# Patient Record
Sex: Female | Born: 1970 | Race: Black or African American | Hispanic: No | Marital: Single | Smoking: Former smoker
Health system: Southern US, Community
[De-identification: ages and names within clinical notes are randomized; demographics above are authoritative.]

## PROBLEM LIST (undated history)

## (undated) DIAGNOSIS — E669 Obesity, unspecified: Secondary | ICD-10-CM

## (undated) DIAGNOSIS — F32A Depression, unspecified: Secondary | ICD-10-CM

## (undated) DIAGNOSIS — M1990 Unspecified osteoarthritis, unspecified site: Secondary | ICD-10-CM

## (undated) DIAGNOSIS — D649 Anemia, unspecified: Secondary | ICD-10-CM

## (undated) DIAGNOSIS — R51 Headache: Secondary | ICD-10-CM

## (undated) DIAGNOSIS — F329 Major depressive disorder, single episode, unspecified: Secondary | ICD-10-CM

## (undated) DIAGNOSIS — R519 Headache, unspecified: Secondary | ICD-10-CM

## (undated) DIAGNOSIS — F419 Anxiety disorder, unspecified: Secondary | ICD-10-CM

## (undated) DIAGNOSIS — E119 Type 2 diabetes mellitus without complications: Secondary | ICD-10-CM

## (undated) HISTORY — DX: Major depressive disorder, single episode, unspecified: F32.9

## (undated) HISTORY — DX: Type 2 diabetes mellitus without complications: E11.9

## (undated) HISTORY — DX: Anxiety disorder, unspecified: F41.9

## (undated) HISTORY — DX: Depression, unspecified: F32.A

## (undated) HISTORY — DX: Obesity, unspecified: E66.9

## (undated) HISTORY — DX: Anemia, unspecified: D64.9

## (undated) HISTORY — PX: OTHER SURGICAL HISTORY: SHX169

## (undated) HISTORY — DX: Unspecified osteoarthritis, unspecified site: M19.90

## (undated) HISTORY — DX: Headache: R51

## (undated) HISTORY — PX: TUBAL LIGATION: SHX77

## (undated) HISTORY — DX: Headache, unspecified: R51.9

---

## 2001-08-10 ENCOUNTER — Emergency Department (HOSPITAL_COMMUNITY): Admission: EM | Admit: 2001-08-10 | Discharge: 2001-08-10 | Payer: Self-pay | Admitting: Emergency Medicine

## 2001-08-11 ENCOUNTER — Emergency Department (HOSPITAL_COMMUNITY): Admission: EM | Admit: 2001-08-11 | Discharge: 2001-08-11 | Payer: Self-pay | Admitting: Emergency Medicine

## 2001-08-14 ENCOUNTER — Emergency Department (HOSPITAL_COMMUNITY): Admission: EM | Admit: 2001-08-14 | Discharge: 2001-08-14 | Payer: Self-pay | Admitting: Emergency Medicine

## 2002-03-06 ENCOUNTER — Other Ambulatory Visit: Admission: RE | Admit: 2002-03-06 | Discharge: 2002-03-06 | Payer: Self-pay | Admitting: Family Medicine

## 2002-10-04 ENCOUNTER — Emergency Department (HOSPITAL_COMMUNITY): Admission: EM | Admit: 2002-10-04 | Discharge: 2002-10-04 | Payer: Self-pay | Admitting: *Deleted

## 2004-03-18 ENCOUNTER — Other Ambulatory Visit: Admission: RE | Admit: 2004-03-18 | Discharge: 2004-03-18 | Payer: Self-pay | Admitting: Family Medicine

## 2004-04-27 ENCOUNTER — Encounter: Admission: RE | Admit: 2004-04-27 | Discharge: 2004-04-27 | Payer: Self-pay | Admitting: Nephrology

## 2004-07-16 ENCOUNTER — Ambulatory Visit (HOSPITAL_COMMUNITY): Admission: RE | Admit: 2004-07-16 | Discharge: 2004-07-16 | Payer: Self-pay | Admitting: Obstetrics

## 2004-11-11 ENCOUNTER — Emergency Department (HOSPITAL_COMMUNITY): Admission: EM | Admit: 2004-11-11 | Discharge: 2004-11-11 | Payer: Self-pay | Admitting: Family Medicine

## 2006-11-17 ENCOUNTER — Emergency Department (HOSPITAL_COMMUNITY): Admission: EM | Admit: 2006-11-17 | Discharge: 2006-11-17 | Payer: Self-pay | Admitting: Family Medicine

## 2006-11-23 ENCOUNTER — Emergency Department (HOSPITAL_COMMUNITY): Admission: EM | Admit: 2006-11-23 | Discharge: 2006-11-24 | Payer: Self-pay | Admitting: Emergency Medicine

## 2008-02-28 ENCOUNTER — Ambulatory Visit: Payer: Self-pay | Admitting: Internal Medicine

## 2008-02-28 DIAGNOSIS — F411 Generalized anxiety disorder: Secondary | ICD-10-CM | POA: Insufficient documentation

## 2008-02-29 LAB — CONVERTED CEMR LAB
ALT: 16 units/L (ref 0–35)
Albumin: 3.5 g/dL (ref 3.5–5.2)
BUN: 9 mg/dL (ref 6–23)
Basophils Relative: 0.7 % (ref 0.0–1.0)
Chloride: 108 meq/L (ref 96–112)
GFR calc Af Amer: 90 mL/min
HCT: 36.4 % (ref 36.0–46.0)
Hemoglobin: 12.2 g/dL (ref 12.0–15.0)
MCHC: 33.4 g/dL (ref 30.0–36.0)
MCV: 94.6 fL (ref 78.0–100.0)
Monocytes Relative: 5.7 % (ref 3.0–12.0)
Neutrophils Relative %: 67.8 % (ref 43.0–77.0)
Platelets: 206 10*3/uL (ref 150–400)
Potassium: 4.3 meq/L (ref 3.5–5.1)
RDW: 12.5 % (ref 11.5–14.6)
Sodium: 142 meq/L (ref 135–145)
Total Protein: 7.4 g/dL (ref 6.0–8.3)
WBC: 10.8 10*3/uL — ABNORMAL HIGH (ref 4.5–10.5)

## 2008-03-15 ENCOUNTER — Telehealth (INDEPENDENT_AMBULATORY_CARE_PROVIDER_SITE_OTHER): Payer: Self-pay | Admitting: *Deleted

## 2008-03-18 ENCOUNTER — Telehealth (INDEPENDENT_AMBULATORY_CARE_PROVIDER_SITE_OTHER): Payer: Self-pay | Admitting: *Deleted

## 2008-04-22 ENCOUNTER — Telehealth: Payer: Self-pay | Admitting: Internal Medicine

## 2008-05-09 ENCOUNTER — Ambulatory Visit: Payer: Self-pay | Admitting: Internal Medicine

## 2008-05-13 ENCOUNTER — Telehealth: Payer: Self-pay | Admitting: Internal Medicine

## 2008-07-24 ENCOUNTER — Encounter: Payer: Self-pay | Admitting: Internal Medicine

## 2008-07-24 ENCOUNTER — Ambulatory Visit: Payer: Self-pay | Admitting: Internal Medicine

## 2008-08-21 ENCOUNTER — Ambulatory Visit: Payer: Self-pay | Admitting: Internal Medicine

## 2008-08-21 DIAGNOSIS — F329 Major depressive disorder, single episode, unspecified: Secondary | ICD-10-CM

## 2008-09-09 ENCOUNTER — Ambulatory Visit: Payer: Self-pay | Admitting: Licensed Clinical Social Worker

## 2008-09-12 ENCOUNTER — Ambulatory Visit: Payer: Self-pay | Admitting: Licensed Clinical Social Worker

## 2008-09-12 ENCOUNTER — Ambulatory Visit: Payer: Self-pay | Admitting: Internal Medicine

## 2008-09-17 ENCOUNTER — Ambulatory Visit: Payer: Self-pay | Admitting: Licensed Clinical Social Worker

## 2008-10-21 ENCOUNTER — Ambulatory Visit: Payer: Self-pay | Admitting: Licensed Clinical Social Worker

## 2008-10-29 ENCOUNTER — Ambulatory Visit: Payer: Self-pay | Admitting: Internal Medicine

## 2008-10-30 ENCOUNTER — Encounter: Payer: Self-pay | Admitting: Internal Medicine

## 2008-11-11 ENCOUNTER — Telehealth: Payer: Self-pay | Admitting: Family Medicine

## 2008-11-21 ENCOUNTER — Telehealth: Payer: Self-pay | Admitting: Internal Medicine

## 2008-12-16 ENCOUNTER — Ambulatory Visit (HOSPITAL_COMMUNITY): Admission: RE | Admit: 2008-12-16 | Discharge: 2008-12-16 | Payer: Self-pay | Admitting: Internal Medicine

## 2008-12-16 ENCOUNTER — Ambulatory Visit: Payer: Self-pay | Admitting: Internal Medicine

## 2008-12-16 LAB — HM MAMMOGRAPHY

## 2009-01-06 ENCOUNTER — Encounter: Payer: Self-pay | Admitting: Internal Medicine

## 2009-06-23 ENCOUNTER — Encounter: Payer: Self-pay | Admitting: Internal Medicine

## 2009-06-23 ENCOUNTER — Telehealth: Payer: Self-pay | Admitting: Internal Medicine

## 2009-09-05 ENCOUNTER — Telehealth: Payer: Self-pay | Admitting: Internal Medicine

## 2009-09-08 ENCOUNTER — Encounter: Payer: Self-pay | Admitting: Family Medicine

## 2009-12-16 ENCOUNTER — Ambulatory Visit: Payer: Self-pay | Admitting: Family Medicine

## 2009-12-16 ENCOUNTER — Ambulatory Visit: Payer: Self-pay | Admitting: Licensed Clinical Social Worker

## 2009-12-16 ENCOUNTER — Encounter: Payer: Self-pay | Admitting: Internal Medicine

## 2009-12-16 LAB — CONVERTED CEMR LAB
ALT: 24 units/L (ref 0–35)
Alkaline Phosphatase: 69 units/L (ref 39–117)
Basophils Absolute: 0.1 10*3/uL (ref 0.0–0.1)
CO2: 32 meq/L (ref 19–32)
Chloride: 103 meq/L (ref 96–112)
Eosinophils Relative: 0.9 % (ref 0.0–5.0)
Folate: >20 ng/mL
Lymphocytes Relative: 32.7 % (ref 12.0–46.0)
Monocytes Absolute: 0.7 10*3/uL (ref 0.1–1.0)
Monocytes Relative: 7.9 % (ref 3.0–12.0)
Neutro Abs: 5 10*3/uL (ref 1.4–7.7)
Neutrophils Relative %: 57.9 % (ref 43.0–77.0)
Platelets: 288 10*3/uL (ref 150.0–400.0)
Sodium: 139 meq/L (ref 135–145)
TSH: 0.88 microintl units/mL (ref 0.35–5.50)
Transferrin: 235 mg/dL (ref 212.0–360.0)
Vitamin B-12: 1305 pg/mL — ABNORMAL HIGH (ref 211–911)

## 2009-12-18 ENCOUNTER — Ambulatory Visit: Payer: Self-pay | Admitting: Internal Medicine

## 2010-01-22 ENCOUNTER — Emergency Department (HOSPITAL_COMMUNITY): Admission: EM | Admit: 2010-01-22 | Discharge: 2010-01-22 | Payer: Self-pay | Admitting: Family Medicine

## 2010-07-08 ENCOUNTER — Ambulatory Visit: Payer: Self-pay | Admitting: Internal Medicine

## 2010-07-13 LAB — CONVERTED CEMR LAB

## 2010-07-27 LAB — CONVERTED CEMR LAB
AST: 25 units/L (ref 0–37)
Alkaline Phosphatase: 57 units/L (ref 39–117)
Basophils Relative: 0.7 % (ref 0.0–3.0)
Eosinophils Relative: 3 % (ref 0.0–5.0)
HCT: 33.4 % — ABNORMAL LOW (ref 36.0–46.0)
Hemoglobin: 11 g/dL — ABNORMAL LOW (ref 12.0–15.0)
MCV: 95.5 fL (ref 78.0–100.0)
Monocytes Relative: 8.6 % (ref 3.0–12.0)
Neutro Abs: 5.9 10*3/uL (ref 1.4–7.7)
Neutrophils Relative %: 55.8 % (ref 43.0–77.0)
Platelets: 266 10*3/uL (ref 150.0–400.0)
RDW: 14 % (ref 11.5–14.6)
Total Protein: 7.6 g/dL (ref 6.0–8.3)

## 2010-12-06 ENCOUNTER — Encounter: Payer: Self-pay | Admitting: Obstetrics

## 2010-12-13 LAB — CONVERTED CEMR LAB
Basophils Relative: 0.4 % (ref 0.0–1.0)
Eosinophils Absolute: 0.1 10*3/uL (ref 0.0–0.7)
Eosinophils Relative: 0.9 % (ref 0.0–5.0)
HCT: 35.1 % — ABNORMAL LOW (ref 36.0–46.0)
Hemoglobin: 12.1 g/dL (ref 12.0–15.0)
Lymphocytes Relative: 30.9 % (ref 12.0–46.0)
MCHC: 34.6 g/dL (ref 30.0–36.0)
Neutro Abs: 5.5 10*3/uL (ref 1.4–7.7)
RBC: 3.72 M/uL — ABNORMAL LOW (ref 3.87–5.11)
RDW: 12.9 % (ref 11.5–14.6)
WBC: 9.3 10*3/uL (ref 4.5–10.5)

## 2010-12-15 NOTE — Assessment & Plan Note (Signed)
Summary: STOMACH HURT WHEN DRINKING MILK/NJR   Vital Signs:  Patient profile:   40 year old female Weight:      211 pounds BMI:     33.67 Temp:     98.9 degrees F oral Pulse rate:   68 / minute Pulse rhythm:   regular Resp:     12 per minute BP sitting:   126 / 60  (left arm) Cuff size:   regular  Vitals Entered By: Gladis Riffle, RN (July 08, 2010 8:21 AM) CC: c/o stomach pain when eats ice cream or drinks milk x 1 month--requests HIV testing Is Patient Diabetic? No   CC:  c/o stomach pain when eats ice cream or drinks milk x 1 month--requests HIV testing.  History of Present Illness: abdominal discomfort after drinking milk or eating icecream accompanied by a bloated stomach and "gas" duration 1 month  All other systems reviewed and were negative   Preventive Screening-Counseling & Management  Alcohol-Tobacco     Smoking Status: never  Current Problems (verified): 1)  Depression  (ICD-311) 2)  Anxiety  (ICD-300.00)  Current Medications (verified): 1)  None  Allergies: 1)  ! Cymbalta (Duloxetine Hcl) 2)  ! Depakote (Divalproex Sodium)  Past History:  Past Medical History: Last updated: 08/21/2008 Anxiety Depression  Past Surgical History: Last updated: 02/28/2008 Tubal ligation cyst under wisdom tooth  Family History: Last updated: 05/09/2008 father-CABG (unknown age), asthma Family History High cholesterol Family History Hypertension Mother-DM, htn, asthma  Social History: Last updated: 02/28/2008 Occupation:-post of[employer] Single 1 child---healthy Never Smoked Alcohol use-no Regular exercise-no  Risk Factors: Exercise: no (02/28/2008)  Risk Factors: Smoking Status: never (07/08/2010)  Physical Exam  General:  alert and well-developed.   Eyes:  no icterus Abdomen:  Bowel sounds positive,abdomen soft and non-tender without masses, organomegaly or hernias noted. Skin:  turgor normal and color normal.     Impression &  Recommendations:  Problem # 1:  ABDOMINAL PAIN (ICD-789.00)  suspect lactose intolerance discussed avoidance of foods could try lactaid-type products  sxs could be related ot gallbladder---check labs today  Orders: Venipuncture (09811) TLB-CBC Platelet - w/Differential (85025-CBCD) TLB-Hepatic/Liver Function Pnl (80076-HEPATIC)  Appended Document: Orders Update    Clinical Lists Changes  Orders: Added new Service order of Specimen Handling (91478) - Signed      Appended Document: Orders Update    Clinical Lists Changes  Orders: Added new Test order of T-HIV Antibody  (Reflex) [id number]) - Signed

## 2010-12-15 NOTE — Assessment & Plan Note (Signed)
Summary: KNOT IN PINKY FINGER, ACKING WHEN BENDING//SLM   Vital Signs:  Patient profile:   40 year old female Weight:      216 pounds BMI:     34.47 Temp:     98.6 degrees F oral BP sitting:   110 / 70  (left arm) Cuff size:   regular  Vitals Entered By: Susan [name]) (December 16, 2009 12:43 PM)  Reason for Visit "muscle movement" left pinkie pain  History of Present Illness:  Susan [name] is a 40 year old single female, a G1, P1 (BTL) who comes in today for evaluation of multiple symptoms.  She states for the past 3, weeks she's had twitching of her eyelids, fingers, arms and hands and legs.  It comes and goes.  She also complains of difficulty sleeping, depression, anxiety.  She's only been hospitalized twice in her life one for oral surgery.  The other for childbirth x 1.  She denies any major illnesses or injuries.  She states she is allergic to Cymbalta and depokate .  Both of these medications give her rashes.  These were given to her by Dr. Andee [name], her psychiatrist.  She is also seeing Susan [name] for counseling.  She states she is a nonsmoker.  Drinks no alcohol and had a normal period one week ago.  Neurologic review of systems reveals no focal symptoms consistent with MS  she is employed at the [employer]  Allergies: 1)  ! Cymbalta (Duloxetine Hcl) 2)  ! Depakote (Divalproex Sodium)  Past History:  Past medical, surgical, family and social histories (including risk factors) reviewed, and no changes noted (except as noted below).  Past Medical History: Reviewed history from 08/21/2008 and no changes required. Anxiety Depression  Past Surgical History: Reviewed history from 02/28/2008 and no changes required. Tubal ligation cyst under wisdom tooth  Family History: Reviewed history from 05/09/2008 and no changes required. father-CABG (unknown age), asthma Family History High cholesterol Family History Hypertension Mother-DM, htn,  asthma  Social History: Reviewed history from 02/28/2008 and no changes required. 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   Pulses:  R and L carotid,radial,femoral,dorsalis pedis and posterior tibial pulses are full and equal bilaterally Extremities:  No clubbing, cyanosis, edema, or deformity noted with normal full range of motion of all joints.   Neurologic:  No cranial nerve deficits noted. Station and gait are normal. Plantar reflexes are down-going bilaterally. DTRs are symmetrical throughout. Sensory, motor and coordinative functions appear intact. Psych:  Oriented X3, flat affect, subdued, withdrawn, and poor eye contact.     Impression & Recommendations:  Problem # 1:  FATIGUE (ICD-780.79) Assessment New  Orders: Venipuncture (32951) TLB-BMP (Basic Metabolic Panel-BMET) (80048-METABOL) TLB-CBC Platelet - w/Differential (85025-CBCD) TLB-Hepatic/Liver Function Pnl (80076-HEPATIC) TLB-TSH (Thyroid Stimulating Hormone) (84443-TSH) TLB-B12 + Folate Pnl (88416_60630-Z60/FUX) TLB-IBC Pnl (Iron/FE;Transferrin) (83550-IBC) TLB-T4 (Thyrox), Free [id number]) TLB-T3, Free (Triiodothyronine) (84481-T3FREE) TLB-Sedimentation Rate (ESR) (85652-ESR)  Complete Medication List: 1)  Seroquel 100 Mg Tabs (Quetiapine fumarate) .... Take 1 tablet by mouth at bedtime  Patient Instructions: 1)  we will get your blood work today make an appointment to see Dr. Kathie [name]. for follow-up Thursday or Friday  Appended Document: Orders Update Added HIV per Susan [name].   Clinical Lists Changes  Orders: Added new Test order of T-HIV Antibody  (Reflex) [id number]) - Signed

## 2010-12-15 NOTE — Assessment & Plan Note (Signed)
Summary: 2 day fup per dr todd//ccm   Vital Signs:  Patient profile:   40 year old female Weight:      214 pounds BMI:     34.15 Temp:     98.3 degrees F Pulse rate:   86 / minute Resp:     12 per minute BP sitting:   124 / 40  (left arm)  Vitals Entered By: Gladis Riffle, RN (December 18, 2009 8:16 AM) CC: 2 day FU per Dr Tawanna Cooler for muscle twitching Is Patient Diabetic? No Comments also swelling left pinky   CC:  2 day FU per Dr Tawanna Cooler for muscle twitching.  History of Present Illness: She complains of muscle twitching "all over". She complains of sxs in both legs L>R, both arms and right eye. She can see eye lids twitch and has an "internal" sensatrion of arms and legs "like they are twitching". No pain. Sxs ongoing for 3 weeks.  no associated sxs.  she describes lack of sleep due to job.  (stress) reviewed dr todd's note  All other systems reviewed and were negative   Preventive Screening-Counseling & Management  Alcohol-Tobacco     Smoking Status: never  Current Problems (verified): 1)  Depression  (ICD-311) 2)  Anxiety  (ICD-300.00)  Current Medications (verified): 1)  None  Allergies: 1)  ! Cymbalta (Duloxetine Hcl) 2)  ! Depakote (Divalproex Sodium)  Past History:  Past Medical History: Last updated: 08/21/2008 Anxiety Depression  Past Surgical History: Last updated: 02/28/2008 Tubal ligation cyst under wisdom tooth  Family History: Last updated: 05/09/2008 father-CABG (unknown age), asthma Family History High cholesterol Family History Hypertension Mother-DM, htn, asthma  Social History: Last updated: 02/28/2008 Occupation:-post [employer] Single 1 child---healthy Never Smoked Alcohol use-no Regular exercise-no  Risk Factors: Exercise: no (02/28/2008)  Risk Factors: Smoking Status: never (12/18/2009)  Review of Systems       All other systems reviewed and were negative   Physical Exam  General:  Well-developed,well-nourished,in no acute  distress; alert,appropriate and cooperative throughout examination Eyes:  pupils equal and pupils round.  no twitching Ears:  R ear normal and L ear normal.   Neck:  No deformities, masses, or tenderness noted. Chest Wall:  no deformities and no tenderness.   Lungs:  normal respiratory effort and no intercostal retractions.   Heart:  normal rate and regular rhythm.   Abdomen:  soft and non-tender.  overweight Msk:  FROM shoulders and wrists no swelling or tenosynovitis Pulses:  R radial normal and L radial normal.   Extremities:  gait normal  able to get up from seated position Neurologic:  cranial nerves II-XII intact and gait normal.   DTRs normal   Impression & Recommendations:  Problem # 1:  TWITCHING (ICD-781.0)  subjective sxs only unclear etiology that i suspect will self resolve  Orders: Venipuncture (16109) T-Antinuclear Antib (ANA) ([id number])  Problem # 2:  DEPRESSION (ICD-311) she has a flat affect and has a lot of stress at work

## 2011-11-16 LAB — HM PAP SMEAR

## 2012-02-15 ENCOUNTER — Encounter: Payer: Self-pay | Admitting: Internal Medicine

## 2012-02-15 ENCOUNTER — Ambulatory Visit (INDEPENDENT_AMBULATORY_CARE_PROVIDER_SITE_OTHER): Payer: BC Managed Care – PPO | Admitting: Internal Medicine

## 2012-02-15 VITALS — BP 106/66 | Temp 98.3°F | Ht 68.0 in | Wt 233.0 lb

## 2012-02-15 DIAGNOSIS — R300 Dysuria: Secondary | ICD-10-CM

## 2012-02-15 DIAGNOSIS — Z23 Encounter for immunization: Secondary | ICD-10-CM

## 2012-02-15 LAB — POCT URINALYSIS DIPSTICK
Bilirubin, UA: NEGATIVE
Ketones, UA: NEGATIVE
Nitrite, UA: NEGATIVE
pH, UA: 5.5

## 2012-02-15 NOTE — Progress Notes (Signed)
Patient ID: Susan [name], female   DOB: May 24, 1971, 41 y.o.   MRN: [id number]  UTI--she is concerned with urinary frequency and dysuria for 4 months. She was treated for UTI once and states she has taken several ABX since that time without relief.  Past Medical History  Diagnosis Date  . Depression   . Anxiety     History   Social History  . Marital Status: Single    Spouse Name: N/A    Number of Children: N/A  . Years of Education: N/A   Occupational History  . Not on file.   Social History Main Topics  . Smoking status: Never Smoker   . Smokeless tobacco: Not on file  . Alcohol Use: No  . Drug Use: No  . Sexually Active: Not on file   Other Topics Concern  . Not on file   Social History Narrative  . No narrative on file    Past Surgical History  Procedure Date  . Tubal ligation   . Wisdom tooth cyst removed     Family History  Problem Relation Age of Onset  . Asthma Mother   . Hypertension Mother   . Diabetes Mother   . Heart disease Father     CABG  . Asthma Father     Allergies  Allergen Reactions  . Divalproex Sodium     REACTION: itching, hives  . Duloxetine     REACTION: itching, hives    No current outpatient prescriptions on file prior to visit.     patient denies chest pain, shortness of breath, orthopnea. Denies lower extremity edema, abdominal pain, change in appetite, change in bowel movements. Patient denies rashes, musculoskeletal complaints. No other specific complaints in a complete review of systems.   BP 106/66  Temp(Src) 98.3 F (36.8 C) (Oral)  Ht 5\' 8"  (1.727 m)  Wt 233 lb (105.688 kg)  BMI 35.43 kg/m2  Well-developed well-nourished female in no acute distress. HEENT exam atraumatic, normocephalic, extraocular muscles are intact. Neck is supple. No jugular venous distention no thyromegaly. Chest clear to auscultation without increased work of breathing. Cardiac exam S1 and S2 are regular. Abdominal exam active bowel sounds,  soft, nontender. No flank pain.  A/P- dysuria/urinary frequency. Minimally abnormal UA- will check for culture If culture is negative may need to see urology

## 2012-02-22 ENCOUNTER — Telehealth: Payer: Self-pay | Admitting: Internal Medicine

## 2012-02-22 NOTE — Telephone Encounter (Signed)
Pt called req to get lab results. Pls call today.

## 2012-02-23 ENCOUNTER — Telehealth: Payer: Self-pay | Admitting: Internal Medicine

## 2012-02-23 NOTE — Telephone Encounter (Signed)
UA was ok If she is  Still having sxs than urine culture--- urine culture was not done last time (ordered but not done)

## 2012-02-23 NOTE — Telephone Encounter (Signed)
Pt is still having symptoms on dysuria requesting cindy to return call.

## 2012-02-23 NOTE — Telephone Encounter (Signed)
Pt aware.

## 2012-02-23 NOTE — Telephone Encounter (Signed)
Pt is still having dysuria, and would like to proceed with the next step to be diagnosed.

## 2012-02-24 NOTE — Telephone Encounter (Signed)
LMTCB for ur culture.

## 2012-02-24 NOTE — Telephone Encounter (Signed)
See phone note

## 2012-02-24 NOTE — Telephone Encounter (Signed)
LMTCB

## 2012-02-25 NOTE — Telephone Encounter (Signed)
appt scheduled, pt aware

## 2012-02-28 ENCOUNTER — Other Ambulatory Visit (INDEPENDENT_AMBULATORY_CARE_PROVIDER_SITE_OTHER): Payer: BC Managed Care – PPO

## 2012-02-28 DIAGNOSIS — R300 Dysuria: Secondary | ICD-10-CM

## 2012-03-01 LAB — URINE CULTURE

## 2012-03-03 ENCOUNTER — Telehealth: Payer: Self-pay | Admitting: Internal Medicine

## 2012-03-03 DIAGNOSIS — M549 Dorsalgia, unspecified: Secondary | ICD-10-CM

## 2012-03-03 NOTE — Telephone Encounter (Signed)
Pt called and said that she came in for ua on Monday and is req results. Pt said that if result were abnormal that Dr Cato Mulligan would do a referral to urologist. Pls call back asap today.

## 2012-03-07 NOTE — Telephone Encounter (Signed)
Pt aware, she wants to see a urologist, referral order place

## 2012-03-07 NOTE — Telephone Encounter (Signed)
Urine culture from last Monday is normal.

## 2012-06-29 ENCOUNTER — Other Ambulatory Visit (HOSPITAL_COMMUNITY): Payer: Self-pay | Admitting: Urology

## 2012-06-29 DIAGNOSIS — R109 Unspecified abdominal pain: Secondary | ICD-10-CM

## 2012-07-24 ENCOUNTER — Encounter (HOSPITAL_COMMUNITY)
Admission: RE | Admit: 2012-07-24 | Discharge: 2012-07-24 | Disposition: A | Discharge status: Home / Self Care | Payer: BC Managed Care – PPO | Source: Ambulatory Visit | Attending: Urology | Admitting: Urology

## 2012-07-24 ENCOUNTER — Other Ambulatory Visit: Payer: Self-pay | Admitting: Internal Medicine

## 2012-07-24 DIAGNOSIS — Z1231 Encounter for screening mammogram for malignant neoplasm of breast: Secondary | ICD-10-CM

## 2012-07-24 DIAGNOSIS — R109 Unspecified abdominal pain: Secondary | ICD-10-CM

## 2012-07-24 MED ORDER — FUROSEMIDE 10 MG/ML IJ SOLN
40.0000 mg | Freq: Once | INTRAMUSCULAR | Status: AC
  Administered 2012-07-24: 53 mg via INTRAVENOUS
  Filled 2012-07-24: qty 4

## 2012-07-24 MED ORDER — TECHNETIUM TC 99M MERTIATIDE
16.0000 | Freq: Once | INTRAVENOUS | Status: AC | PRN
  Administered 2012-07-24: 16 via INTRAVENOUS

## 2012-07-28 ENCOUNTER — Encounter: Payer: Self-pay | Admitting: Internal Medicine

## 2012-07-28 ENCOUNTER — Ambulatory Visit (INDEPENDENT_AMBULATORY_CARE_PROVIDER_SITE_OTHER): Payer: BC Managed Care – PPO | Admitting: Internal Medicine

## 2012-07-28 VITALS — BP 108/64 | HR 86 | Temp 98.1°F | Wt 234.0 lb

## 2012-07-28 DIAGNOSIS — R358 Other polyuria: Secondary | ICD-10-CM

## 2012-07-28 DIAGNOSIS — R81 Glycosuria: Secondary | ICD-10-CM | POA: Insufficient documentation

## 2012-07-28 LAB — BASIC METABOLIC PANEL
BUN: 11 mg/dL (ref 6–23)
Chloride: 103 mEq/L (ref 96–112)
Glucose, Bld: 111 mg/dL — ABNORMAL HIGH (ref 70–99)
Potassium: 3.7 mEq/L (ref 3.5–5.1)

## 2012-07-28 LAB — GLUCOSE, POCT (MANUAL RESULT ENTRY): POC Glucose: 110 mg/dl — AB (ref 70–99)

## 2012-07-28 NOTE — Progress Notes (Signed)
  Subjective:    Patient ID: Susan [name], female    DOB: April 28, 1971, 41 y.o.   MRN: [id number]  HPI  41 year old African American female complains of polyuria. Patient recently seen by her urologist for nocturia and right flank pain. Workup included renal ultrasound. It showed right extrarenal pelvis without evidence of obstruction. Her UA however did show urine glucose greater than 1000.  Urologist referred her to use for further evaluation.  Patient reports Susan [name] has been experiencing polyuria and nocturia since November 2012. Susan [name] denies history of type 2 diabetes. Her mother is prediabetic.  Patient follows fairly healthy diet. Susan [name] does not often consume sweets or desserts.   Review of Systems No significant weight change.  Susan [name] denies blurry vision  Past Medical History  Diagnosis Date  . Depression   . Anxiety     History   Social History  . Marital Status: Single    Spouse Name: N/A    Number of Children: N/A  . Years of Education: N/A   Occupational History  . Not on file.   Social History Main Topics  . Smoking status: Never Smoker   . Smokeless tobacco: Not on file  . Alcohol Use: No  . Drug Use: No  . Sexually Active: Not on file   Other Topics Concern  . Not on file   Social History Narrative  . No narrative on file    Past Surgical History  Procedure Date  . Tubal ligation   . Wisdom tooth cyst removed     Family History  Problem Relation Age of Onset  . Asthma Mother   . Hypertension Mother   . Diabetes Mother   . Heart disease Father     CABG  . Asthma Father     Allergies  Allergen Reactions  . Divalproex Sodium     REACTION: itching, hives  . Duloxetine     REACTION: itching, hives    No current outpatient prescriptions on file prior to visit.    BP 108/64  Pulse 86  Temp 98.1 F (36.7 C)  Wt 234 lb (106.142 kg)  SpO2 98%  LMP 07/08/2012       Objective:   Physical Exam  Constitutional: Susan [name] appears well-developed and  well-nourished.  HENT:  Head: Normocephalic and atraumatic.  Cardiovascular: Normal rate, regular rhythm and normal heart sounds.   Pulmonary/Chest: Effort normal and breath sounds normal.  Musculoskeletal:       Trace lower extremity edema  Skin: Skin is warm and dry.  Psychiatric: Susan [name] has a normal mood and affect. Her behavior is normal.          Assessment & Plan:

## 2012-07-28 NOTE — Assessment & Plan Note (Signed)
41 year old [place] American female with significant glucosuria. She was recently seen by her urologist and UA showed glucose greater than 1000. She's been experiencing polyuria and nocturia since November of 2012. Her point-of-care blood sugar today was 120. Check hemoglobin A1c and BMET.

## 2012-07-28 NOTE — Patient Instructions (Addendum)
Our office will contact you re: blood test results 

## 2012-07-31 ENCOUNTER — Encounter: Payer: Self-pay | Admitting: Internal Medicine

## 2012-07-31 ENCOUNTER — Ambulatory Visit (INDEPENDENT_AMBULATORY_CARE_PROVIDER_SITE_OTHER): Payer: BC Managed Care – PPO | Admitting: Internal Medicine

## 2012-07-31 VITALS — BP 122/82 | Temp 98.9°F | Wt 236.0 lb

## 2012-07-31 DIAGNOSIS — E119 Type 2 diabetes mellitus without complications: Secondary | ICD-10-CM | POA: Insufficient documentation

## 2012-07-31 MED ORDER — METFORMIN HCL 500 MG PO TABS: 500.0000 mg | ORAL_TABLET | Freq: Two times a day (BID) | ORAL | Status: DC

## 2012-07-31 NOTE — Progress Notes (Signed)
  Subjective:    Patient ID: Susan [name], female    DOB: [birth year], 41 y.o.   MRN: [id number]  HPI  41 year old [place] American female for follow up regarding glucouria.  We reviewed the patient's electrolytes, kidney function and hemoglobin A1c. Her hemoglobin A1c elevated at 6.6. Patient reports her polyuria is intermittent.  As previously noted her mother is prediabetic.  Review of Systems  No chest pain  Past Medical History  Diagnosis Date  . Depression   . Anxiety     History   Social History  . Marital Status: Single    Spouse Name: N/A    Number of Children: N/A  . Years of Education: N/A   Occupational History  . Not on file.   Social History Main Topics  . Smoking status: Never Smoker   . Smokeless tobacco: Not on file  . Alcohol Use: No  . Drug Use: No  . Sexually Active: Not on file   Other Topics Concern  . Not on file   Social History Narrative  . No narrative on file    Past Surgical History  Procedure Date  . Tubal ligation   . Wisdom tooth cyst removed     Family History  Problem Relation Age of Onset  . Asthma Mother   . Hypertension Mother   . Diabetes Mother   . Heart disease Father     CABG  . Asthma Father     Allergies  Allergen Reactions  . Divalproex Sodium     REACTION: itching, hives  . Duloxetine     REACTION: itching, hives    Current Outpatient Prescriptions on File Prior to Visit  Medication Sig Dispense Refill  . metFORMIN (GLUCOPHAGE) 500 MG tablet Take 1 tablet (500 mg total) by mouth 2 (two) times daily with a meal.  60 tablet  3    BP 122/82  Temp 98.9 F (37.2 C) (Oral)  Wt 236 lb (107.049 kg)  LMP 07/08/2012       Objective:   Physical Exam  Constitutional: She appears well-developed and well-nourished.  Cardiovascular: Normal rate, regular rhythm and normal heart sounds.   Pulmonary/Chest: Effort normal and breath sounds normal. She has no wheezes.  Musculoskeletal: She exhibits no edema.    Skin: Skin is warm and dry.          Assessment & Plan:

## 2012-07-31 NOTE — Assessment & Plan Note (Addendum)
41 year old [place] American female with new onset type 2 diabetes. Her diabetes is mild. A1c is 6.6. Refer to diabetic educator for additional counseling. New glucometer provided.  I stressed importance of life style changes.  Patient instructed to limit her carbohydrate intake to 25-30 g per meal. I encouraged regular exercise.  Reassess in 6 weeks.

## 2012-08-04 ENCOUNTER — Telehealth: Payer: Self-pay | Admitting: Internal Medicine

## 2012-08-04 MED ORDER — GLUCOSE BLOOD VI STRP: ORAL_STRIP | Status: DC

## 2012-08-04 NOTE — Telephone Encounter (Signed)
Patient called stating that her test strips for her contour meter were not called in her pharmacy is [facility] [address]. Please assist.

## 2012-08-04 NOTE — Telephone Encounter (Signed)
rx sent in electronically 

## 2012-08-08 ENCOUNTER — Encounter: Payer: Self-pay | Admitting: Dietician

## 2012-08-08 ENCOUNTER — Encounter: Payer: BC Managed Care – PPO | Attending: Internal Medicine | Admitting: Dietician

## 2012-08-08 VITALS — Ht 68.0 in | Wt 229.4 lb

## 2012-08-08 DIAGNOSIS — Z713 Dietary counseling and surveillance: Secondary | ICD-10-CM | POA: Insufficient documentation

## 2012-08-08 DIAGNOSIS — E119 Type 2 diabetes mellitus without complications: Secondary | ICD-10-CM

## 2012-08-08 NOTE — Progress Notes (Signed)
  Medical Nutrition Therapy:  Appt start time: 1130 end time:  1300.   Assessment:  Primary concerns today: Lean how to take care of myself and learn what to eat to help control my blood glucose and to lose weight.  New onset DM type 2 with an A1C at 6.6%.  Has taken the information given by her MD to heart and on arrival today, she has lost 6.6 lb since her MD visit on 9/16 /2013.  She has questions regarding food labels, foods without food labels.  She is questioning and willing to learn.  Currently working a the [employer] on the 8 PM-4 AM shift.  She sleeps through the day.  So far she has made a rule that she will not eat at work.  We discussed the implications of this rule for her blood glucose levels.     HYPOGLYCEMIA: Gives no S/S of having experienced any episodes of low blood glucose.  HYPERGLYCEMIA: Notes within the last few days, she is going to the bathroom a lot less and is less thirsty.  BLOOD GLUCOSE TESTING:  Recently received meter.  Started testing.  Fasting level:  4:00 PM= 108, 98.     MEDICATIONS: Review of medications completed.  Diabetes medication Metformin 500 mg twice daily with a meal.   DIETARY INTAKE:  Usual eating pattern includes 1-2 meals and 1-2 snacks per day.  Everyday foods include Soup, salad, vegetables.  Avoided foods include At this time fruit, bread, starches, milk..    24-hr recall:  B ( AM): 11-12:00 Noon will have a little snack then.  Some soup, that has vegetables and has less potatoes and corn. 1 cup,  OR maybe a pack pack of the Lance cheddar and PB crackers at 25 gm carbs.  Snk ( AM): 4:00 boiled eggs 3, water to drink.  L ( PM): 12:00 MN. 1 slice of pizza. Snk ( PM): 10:30 a small amount of yogurt. D ( PM): Not always in her schedule.  Skipping food at work on most nights. Snk ( PM): none Beverages: Water, unsweetened tea.  Usual physical activity: Walking daily for 30-60 minutes.   Estimated energy needs: HT:68 in  WT: 229.4 lb   BMI: 35 kg/m2  Adj WT:  169 lb (77 kg) 1400-1500 calories 1135-140 g carbohydrates 120-125 g protein 36-39 g fat  Progress Towards Goal(s):  In progress.   Nutritional Diagnosis:  [place]-2.1 Inpaired nutrition utilization As related to glucose metaboliam.  As evidenced by recent diagnosis of type 2 diabetes with A1C of 6.6% and a family history for prediabetes..    Intervention:  Nutrition Review of her current diet reveals a calorie intake of less than 1000 calories.Encouraged to eat a meal or large snack about every 5 hours during her waking hours.  This will help keep up her metabolism and facilitate her continued weight loss.  Handouts given during visit include:  Living Well with Diabetes  Carb Counting Guide  Yellow Card with diet prescription of 30-45 gm of CHO for meals.  If having snacks, keep at 30 gm per meal.  If not having snacks, then Daijha Leggio increase to 45 gm CHO per meal.  ADA Standards of Care.  Menu with suggestions for 30 gm CHO meals.  Snack Suggestion List  Monitoring/Evaluation:  Dietary intake, exercise, blood glucose, and body weight 8-12 weeks and call or e-mail questions in the meantime.

## 2012-08-08 NOTE — Patient Instructions (Addendum)
   For the cracked area between your toes.  Wash and dry well and apply a foot, fungal powder like Tinactin.  See if this improves it and if not improved in about 4-5 days, call the MD and see if he has a suggestion for care.  For the dry skin on your feet.  Use a pumice stone that is wet against wet skin and take off the flakey, dry skin a little at a time and then use a lotion with lanolin such as the Gold Bond for Diabetics.   Use your food labels.  Fiber:  Bread = 2 gm per slice   Cereal= 3+ gm per serving.  Sugar:  Try to keep at (0-9 gm) per serving.  For cereals try to stay at 5-7 gm and no more.  Continue to use the non-starchy free veggies in your salads and in your soups.  Remember the starches/grains, fruits, are 15 gm carb per serving/portion.  Milk will be 12 gm/serving /portion.  Protein/meat for a meal serving size is the palm of your hand.  Practice measuring portions at home.  Try using the Calorieking.com web site for information on foods that you Sherolyn Trettin not have a food label.  When going out to eat, consider looking up the restaurant menu at their web site.  At [facility], eat half of the 6 inch bun to keep the carb content of the sandwich closer to 30 gms.  Plan to call or e-mail with questions.  Plan a follow-up visit in 8-12 weeks with more questions.    I enjoyed working with you today.

## 2012-09-11 ENCOUNTER — Encounter: Payer: Self-pay | Admitting: Internal Medicine

## 2012-09-11 ENCOUNTER — Ambulatory Visit (INDEPENDENT_AMBULATORY_CARE_PROVIDER_SITE_OTHER): Payer: BC Managed Care – PPO | Admitting: Internal Medicine

## 2012-09-11 VITALS — BP 104/68 | HR 64 | Temp 98.0°F | Wt 219.0 lb

## 2012-09-11 DIAGNOSIS — T781XXA Other adverse food reactions, not elsewhere classified, initial encounter: Secondary | ICD-10-CM

## 2012-09-11 DIAGNOSIS — Z91018 Allergy to other foods: Secondary | ICD-10-CM

## 2012-09-11 DIAGNOSIS — IMO0002 Reserved for concepts with insufficient information to code with codable children: Secondary | ICD-10-CM

## 2012-09-11 MED ORDER — METFORMIN HCL 500 MG PO TABS: 750.0000 mg | ORAL_TABLET | Freq: Two times a day (BID) | ORAL | Status: DC

## 2012-09-11 NOTE — Assessment & Plan Note (Signed)
Blood sugars significantly improved. She has lost more than 15 pounds since her initial diagnosis of diabetes. She is tolerating metformin without difficulty. Titrate to 750 mg twice daily. Patient to obtain diabetic eye exam before next office visit. Monitor A1c.

## 2012-09-11 NOTE — Patient Instructions (Addendum)
Please complete the following lab tests before your next follow up appointment: BMET, A1c, FLP, LFTs - 250.00 Food allergy panel (food profile specific, seafood profile and nut mix profile) 955.7

## 2012-09-11 NOTE — Progress Notes (Signed)
  Subjective:    Patient ID: Susan [name], female    DOB: 1971-06-27, 41 y.o.   MRN: [id number]  HPI  41 year old [place] American female for followup regarding type 2 diabetes. Since starting metformin and changing her diet patient reports her urinary frequency has significantly improved. She has managed to lose significant amount of weight since previous visit. She has met with nutritionist is following a carb modified diet.  Patient reports one of her family members recently diagnosed with multiple food allergies. She requests food allergy testing.  Review of Systems Negative for chest pain  Past Medical History  Diagnosis Date  . Depression   . Anxiety   . Obesity     History   Social History  . Marital Status: Single    Spouse Name: N/A    Number of Children: N/A  . Years of Education: N/A   Occupational History  . Not on file.   Social History Main Topics  . Smoking status: Never Smoker   . Smokeless tobacco: Not on file  . Alcohol Use: No  . Drug Use: No  . Sexually Active: Not on file   Other Topics Concern  . Not on file   Social History Narrative  . No narrative on file    Past Surgical History  Procedure Date  . Tubal ligation   . Wisdom tooth cyst removed     Family History  Problem Relation Age of Onset  . Asthma Mother   . Hypertension Mother   . Diabetes Mother   . Heart disease Father     CABG  . Asthma Father     Allergies  Allergen Reactions  . Divalproex Sodium     REACTION: itching, hives  . Duloxetine     REACTION: itching, hives    Current Outpatient Prescriptions on File Prior to Visit  Medication Sig Dispense Refill  . glucose blood (BAYER CONTOUR NEXT TEST) test strip Once daily or as instructed  100 each  12  . DISCONTD: metFORMIN (GLUCOPHAGE) 500 MG tablet Take 1 tablet (500 mg total) by mouth 2 (two) times daily with a meal.  60 tablet  3    BP 104/68  Pulse 64  Temp 98 F (36.7 C) (Oral)  Wt 219 lb (99.338  kg)       Objective:   Physical Exam  Constitutional: She appears well-developed and well-nourished.  Cardiovascular: Normal rate and normal heart sounds.   No murmur heard. Pulmonary/Chest: Effort normal and breath sounds normal. She has no wheezes.  Skin: Skin is warm and dry.  Psychiatric: She has a normal mood and affect. Her behavior is normal.          Assessment & Plan:

## 2012-09-11 NOTE — Assessment & Plan Note (Signed)
Patient reports one of her family members diagnosed with multiple food allergies. Patient would like food allergy testing.

## 2012-09-15 ENCOUNTER — Telehealth: Payer: Self-pay | Admitting: Internal Medicine

## 2012-09-15 NOTE — Telephone Encounter (Signed)
Pt called and said that she is having problems with the med she was given, so she hasn't taken it in 3 days. Pt just wanted to make nurse aware. She did not want a call back.

## 2012-09-18 NOTE — Telephone Encounter (Signed)
Med list and allergies updated.

## 2012-09-18 NOTE — Telephone Encounter (Signed)
Pt was referring to the Metformin.  She states that it gave her whelps on her legs and rt side.  Pt just wanted Dr Artist Pais to be aware that she has stopped it

## 2012-09-18 NOTE — Telephone Encounter (Signed)
Please update pt's med list and allergies.  thx

## 2012-11-13 ENCOUNTER — Encounter: Payer: Self-pay | Admitting: Internal Medicine

## 2012-11-13 ENCOUNTER — Ambulatory Visit (INDEPENDENT_AMBULATORY_CARE_PROVIDER_SITE_OTHER): Payer: BC Managed Care – PPO | Admitting: Internal Medicine

## 2012-11-13 VITALS — BP 102/68 | HR 64 | Temp 97.9°F | Wt 201.0 lb

## 2012-11-13 DIAGNOSIS — E119 Type 2 diabetes mellitus without complications: Secondary | ICD-10-CM

## 2012-11-13 DIAGNOSIS — T781XXA Other adverse food reactions, not elsewhere classified, initial encounter: Secondary | ICD-10-CM

## 2012-11-13 DIAGNOSIS — Z91018 Allergy to other foods: Secondary | ICD-10-CM

## 2012-11-13 MED ORDER — METFORMIN HCL 500 MG PO TABS: 750.0000 mg | ORAL_TABLET | Freq: Two times a day (BID) | ORAL | Status: DC

## 2012-11-13 NOTE — Assessment & Plan Note (Signed)
Patient lost total of 30 pounds since her initial diagnosis of diabetes. She is tolerating metformin 750 mg twice daily. Monitor A1c. Patient may be able to control her diabetes with diet and exercise alone.

## 2012-11-13 NOTE — Progress Notes (Signed)
  Subjective:    Patient ID: Susan [name], female    DOB: 11-25-1970, 41 y.o.   MRN: [id number]  HPI  41 year old [place] American female for followup for type 2 diabetes. Overall patient has been doing well. She has lost approximately 15 pounds since previous visit. She is tolerating metformin without difficulty. She checks her blood sugars in the morning. He usually range in the low 100s. Patient reports following diabetic diet.  Patient requests food allergy testing. Patient reports mother tested positive for multiple food allergies. She has never had allergic reaction to foods in the past.  Review of Systems Negative for chest pain  Past Medical History  Diagnosis Date  . Depression   . Anxiety   . Obesity     History   Social History  . Marital Status: Single    Spouse Name: N/A    Number of Children: N/A  . Years of Education: N/A   Occupational History  . Not on file.   Social History Main Topics  . Smoking status: Never Smoker   . Smokeless tobacco: Not on file  . Alcohol Use: No  . Drug Use: No  . Sexually Active: Not on file   Other Topics Concern  . Not on file   Social History Narrative  . No narrative on file    Past Surgical History  Procedure Date  . Tubal ligation   . Wisdom tooth cyst removed     Family History  Problem Relation Age of Onset  . Asthma Mother   . Hypertension Mother   . Diabetes Mother   . Heart disease Father     CABG  . Asthma Father     Allergies  Allergen Reactions  . Divalproex Sodium     REACTION: itching, hives  . Duloxetine     REACTION: itching, hives  . Metformin And Related Rash    Current Outpatient Prescriptions on File Prior to Visit  Medication Sig Dispense Refill  . glucose blood (BAYER CONTOUR NEXT TEST) test strip Once daily or as instructed  100 each  12    BP 102/68  Pulse 64  Temp 97.9 F (36.6 C) (Oral)  Wt 201 lb (91.173 kg)       Objective:   Physical Exam  Constitutional: She  is oriented to person, place, and time. She appears well-developed and well-nourished.  Cardiovascular: Normal rate, regular rhythm and normal heart sounds.   Pulmonary/Chest: Effort normal and breath sounds normal. She has no wheezes.  Neurological: She is oriented to person, place, and time. No cranial nerve deficit.  Skin: Skin is warm and dry.  Psychiatric: She has a normal mood and affect. Her behavior is normal.          Assessment & Plan:

## 2012-11-13 NOTE — Patient Instructions (Addendum)
Please complete the following lab tests before your next follow up appointment: BMET, A1c - 250.00 

## 2012-11-14 LAB — LIPID PANEL
Total CHOL/HDL Ratio: 3
Triglycerides: 65 mg/dL (ref 0.0–149.0)
VLDL: 13 mg/dL (ref 0.0–40.0)

## 2012-11-14 LAB — ALLERGEN FOOD PROFILE SPECIFIC IGE
Milk IgE: <0.1 kU/L
Orange: <0.1 kU/L
Peanut IgE: <0.1 kU/L
Soybean IgE: <0.1 kU/L
Tuna IgE: <0.1 kU/L
Wheat IgE: <0.1 kU/L

## 2012-11-14 LAB — BASIC METABOLIC PANEL
BUN: 14 mg/dL (ref 6–23)
Calcium: 9.3 mg/dL (ref 8.4–10.5)
Glucose, Bld: 89 mg/dL (ref 70–99)
Potassium: 4.2 mEq/L (ref 3.5–5.1)

## 2012-11-14 LAB — HEMOGLOBIN A1C: Hgb A1c MFr Bld: 6.3 % (ref 4.6–6.5)

## 2012-11-14 LAB — HEPATIC FUNCTION PANEL
AST: 20 U/L (ref 0–37)
Alkaline Phosphatase: 47 U/L (ref 39–117)
Total Bilirubin: 0.5 mg/dL (ref 0.3–1.2)
Total Protein: 7.4 g/dL (ref 6.0–8.3)

## 2013-02-27 ENCOUNTER — Telehealth: Payer: Self-pay | Admitting: Internal Medicine

## 2013-02-27 MED ORDER — GLUCOSE BLOOD VI STRP: ORAL_STRIP | Status: DC

## 2013-02-27 NOTE — Telephone Encounter (Signed)
rx sent in electronically 

## 2013-02-27 NOTE — Telephone Encounter (Signed)
PT called and stated that she needs glucose test strips. Please assist.

## 2013-05-07 ENCOUNTER — Other Ambulatory Visit: Payer: BC Managed Care – PPO

## 2013-05-14 ENCOUNTER — Ambulatory Visit: Payer: BC Managed Care – PPO | Admitting: Internal Medicine

## 2013-06-01 ENCOUNTER — Telehealth: Payer: Self-pay | Admitting: Internal Medicine

## 2013-06-01 ENCOUNTER — Other Ambulatory Visit (INDEPENDENT_AMBULATORY_CARE_PROVIDER_SITE_OTHER): Payer: BC Managed Care – PPO

## 2013-06-01 ENCOUNTER — Ambulatory Visit: Payer: BC Managed Care – PPO | Admitting: Internal Medicine

## 2013-06-01 DIAGNOSIS — E1149 Type 2 diabetes mellitus with other diabetic neurological complication: Secondary | ICD-10-CM

## 2013-06-01 LAB — BASIC METABOLIC PANEL
BUN: 14 mg/dL (ref 6–23)
CO2: 26 mEq/L (ref 19–32)
Calcium: 9.5 mg/dL (ref 8.4–10.5)
Chloride: 107 mEq/L (ref 96–112)
Creatinine, Ser: 0.7 mg/dL (ref 0.4–1.2)
GFR: 118.88 mL/min (ref >60.00)
Glucose, Bld: 90 mg/dL (ref 70–99)
Potassium: 3.9 mEq/L (ref 3.5–5.1)
Sodium: 140 mEq/L (ref 135–145)

## 2013-06-01 LAB — HEMOGLOBIN A1C: Hgb A1c MFr Bld: 6.4 % (ref 4.6–6.5)

## 2013-06-01 NOTE — Telephone Encounter (Signed)
Pt will need a callback after her labs today to know whether she should refill her metformin. pt also needs to know if she should keep august appt. (after labs today)  She was supposed to have a fup w/ dr Artist Pais next week,

## 2013-06-01 NOTE — Telephone Encounter (Signed)
I weighed pt today and she weighed 178lb, she wanted Dr Artist Pais to be aware

## 2013-06-04 NOTE — Telephone Encounter (Signed)
Yes, she should keep taking metformin.  A1c is still elevated at 6.4.  I suggest she keep appt in Aug.

## 2013-06-05 NOTE — Telephone Encounter (Signed)
Left message on pts cell phone with Dr Yoo's advice 

## 2013-06-08 ENCOUNTER — Ambulatory Visit: Payer: BC Managed Care – PPO | Admitting: Internal Medicine

## 2013-06-19 ENCOUNTER — Ambulatory Visit: Payer: BC Managed Care – PPO | Admitting: Internal Medicine

## 2013-08-02 ENCOUNTER — Telehealth: Payer: Self-pay | Admitting: Internal Medicine

## 2013-08-02 NOTE — Telephone Encounter (Signed)
Pt request refill of metFORMIN (GLUCOPHAGE) 500 MG tablet 1 / BID glucose blood (BAYER CONTOUR NEXT TEST) test strip Pharm: [facility] [address]

## 2013-08-03 MED ORDER — GLUCOSE BLOOD VI STRP: ORAL_STRIP | Status: DC

## 2013-08-03 MED ORDER — METFORMIN HCL 500 MG PO TABS: 750.0000 mg | ORAL_TABLET | Freq: Two times a day (BID) | ORAL | Status: DC

## 2013-09-20 ENCOUNTER — Other Ambulatory Visit: Payer: Self-pay

## 2013-11-28 ENCOUNTER — Telehealth: Payer: Self-pay | Admitting: Internal Medicine

## 2013-11-28 NOTE — Telephone Encounter (Signed)
Pt needs an appt, has not been seen since December 2013

## 2013-11-28 NOTE — Telephone Encounter (Signed)
Pt is requesting a refill of metFORMIN (GLUCOPHAGE) 500 MG tablet. Please advise.

## 2013-11-30 ENCOUNTER — Encounter: Payer: Self-pay | Admitting: Internal Medicine

## 2013-11-30 ENCOUNTER — Ambulatory Visit (INDEPENDENT_AMBULATORY_CARE_PROVIDER_SITE_OTHER): Payer: BC Managed Care – PPO | Admitting: Internal Medicine

## 2013-11-30 VITALS — BP 102/74 | HR 72 | Temp 98.5°F | Ht 68.0 in | Wt 180.0 lb

## 2013-11-30 DIAGNOSIS — E119 Type 2 diabetes mellitus without complications: Secondary | ICD-10-CM

## 2013-11-30 LAB — BASIC METABOLIC PANEL
BUN: 11 mg/dL (ref 6–23)
CALCIUM: 9.7 mg/dL (ref 8.4–10.5)
CO2: 28 mEq/L (ref 19–32)
Chloride: 104 mEq/L (ref 96–112)
Creatinine, Ser: 0.8 mg/dL (ref 0.4–1.2)
GFR: 98.57 mL/min (ref >60.00)
GLUCOSE: 91 mg/dL (ref 70–99)
Potassium: 4 mEq/L (ref 3.5–5.1)
Sodium: 138 mEq/L (ref 135–145)

## 2013-11-30 LAB — MICROALBUMIN / CREATININE URINE RATIO
Creatinine,U: 83.7 mg/dL
Microalb Creat Ratio: 0.2 mg/g (ref 0.0–30.0)
Microalb, Ur: 0.2 mg/dL (ref 0.0–1.9)

## 2013-11-30 LAB — HEMOGLOBIN A1C: HEMOGLOBIN A1C: 6.1 % (ref 4.6–6.5)

## 2013-11-30 LAB — LIPID PANEL
CHOL/HDL RATIO: 2
Cholesterol: 182 mg/dL (ref 0–200)
HDL: 74.4 mg/dL (ref >39.00)
LDL CALC: 102 mg/dL — AB (ref 0–99)
Triglycerides: 27 mg/dL (ref 0.0–149.0)
VLDL: 5.4 mg/dL (ref 0.0–40.0)

## 2013-11-30 NOTE — Progress Notes (Signed)
   Subjective:    Patient ID: Susan [name], female    DOB: 01/28/1971, 43 y.o.   MRN: [id number]  HPI  43 year old [place] American female for followup regarding type 2 diabetes. It has been almost a year since her previous visit. Patient reports good compliance with metformin. She also has been following a low carb diet and exercising on a daily basis.  Patient has lost approximately 40-50 pounds within the last year.  Review of Systems Negative for chest pain or shortness of breath. Her overall energy level has also improved     Past Medical History  Diagnosis Date  . Depression   . Anxiety   . Obesity     History   Social History  . Marital Status: Single    Spouse Name: N/A    Number of Children: N/A  . Years of Education: N/A   Occupational History  . Not on file.   Social History Main Topics  . Smoking status: Never Smoker   . Smokeless tobacco: Not on file  . Alcohol Use: No  . Drug Use: No  . Sexual Activity: Not on file   Other Topics Concern  . Not on file   Social History Narrative  . No narrative on file    Past Surgical History  Procedure Laterality Date  . Tubal ligation    . Wisdom tooth cyst removed      Family History  Problem Relation Age of Onset  . Asthma Mother   . Hypertension Mother   . Diabetes Mother   . Heart disease Father     CABG  . Asthma Father     Allergies  Allergen Reactions  . Divalproex Sodium     REACTION: itching, hives  . Duloxetine     REACTION: itching, hives  . Metformin And Related Rash    Current Outpatient Prescriptions on File Prior to Visit  Medication Sig Dispense Refill  . glucose blood (BAYER CONTOUR NEXT TEST) test strip Once daily or as instructed  100 each  3  . metFORMIN (GLUCOPHAGE) 500 MG tablet Take 1.5 tablets (750 mg total) by mouth 2 (two) times daily with a meal. Take one and half tab twice daily  270 tablet  1   No current facility-administered medications on file prior to visit.      BP 102/74  Pulse 72  Temp(Src) 98.5 F (36.9 C) (Oral)  Ht 5\' 8"  (1.727 m)  Wt 180 lb (81.647 kg)  BMI 27.38 kg/m2    Objective:   Physical Exam  Constitutional: She is oriented to person, place, and time. She appears well-developed and well-nourished. No distress.  Cardiovascular: Normal rate, regular rhythm, normal heart sounds and intact distal pulses.   No murmur heard. Pulmonary/Chest: Effort normal and breath sounds normal. She has no wheezes.  Musculoskeletal: She exhibits no edema.  Neurological: She is alert and oriented to person, place, and time. No cranial nerve deficit.  Psychiatric: She has a normal mood and affect. Her behavior is normal.          Assessment & Plan:

## 2013-11-30 NOTE — Patient Instructions (Addendum)
Please sign on to MyChart to view your test results. Please complete the following lab tests before your next follow up appointment: BMET, A1c - 250.00

## 2013-11-30 NOTE — Progress Notes (Signed)
Pre visit review using our clinic review tool, if applicable. No additional management support is needed unless otherwise documented below in the visit note. 

## 2013-11-30 NOTE — Assessment & Plan Note (Signed)
Patient has done a great job of following low-carb diet and regular exercise program. She has lost approximately 40-50 pounds within the last year.  Patient's hemoglobin A1c may normalize. Continue metformin for now.  If A1c is less than 6, we discussed discontinuing metformin and reassessing in 3 months.

## 2013-12-04 ENCOUNTER — Other Ambulatory Visit: Payer: Self-pay | Admitting: *Deleted

## 2013-12-04 ENCOUNTER — Telehealth: Payer: Self-pay

## 2013-12-04 MED ORDER — METFORMIN HCL 500 MG PO TABS: 750.0000 mg | ORAL_TABLET | Freq: Two times a day (BID) | ORAL | Status: DC

## 2013-12-04 NOTE — Telephone Encounter (Signed)
Relevant patient education mailed to patient.  

## 2014-05-30 ENCOUNTER — Other Ambulatory Visit: Payer: BC Managed Care – PPO

## 2014-06-03 ENCOUNTER — Ambulatory Visit: Payer: BC Managed Care – PPO | Admitting: Internal Medicine

## 2014-06-05 ENCOUNTER — Ambulatory Visit: Payer: BC Managed Care – PPO | Admitting: Internal Medicine

## 2014-06-05 ENCOUNTER — Telehealth: Payer: Self-pay | Admitting: Internal Medicine

## 2014-06-05 NOTE — Telephone Encounter (Signed)
Pt has cpe on 8/24, but will be out of metFORMIN (GLUCOPHAGE) 500 MG tablet In 3 days. Can you send enough o get [facility] [employer] [facility]

## 2014-06-06 MED ORDER — METFORMIN HCL 500 MG PO TABS: 750.0000 mg | ORAL_TABLET | Freq: Two times a day (BID) | ORAL | Status: DC

## 2014-06-06 NOTE — Telephone Encounter (Signed)
rx sent in electronically 

## 2014-06-28 ENCOUNTER — Other Ambulatory Visit (INDEPENDENT_AMBULATORY_CARE_PROVIDER_SITE_OTHER): Payer: BC Managed Care – PPO

## 2014-06-28 DIAGNOSIS — E1165 Type 2 diabetes mellitus with hyperglycemia: Principal | ICD-10-CM

## 2014-06-28 DIAGNOSIS — IMO0001 Reserved for inherently not codable concepts without codable children: Secondary | ICD-10-CM

## 2014-06-28 LAB — BASIC METABOLIC PANEL
BUN: 11 mg/dL (ref 6–23)
CALCIUM: 9.3 mg/dL (ref 8.4–10.5)
CO2: 30 mEq/L (ref 19–32)
Chloride: 103 mEq/L (ref 96–112)
Creatinine, Ser: 0.7 mg/dL (ref 0.4–1.2)
GFR: 112.63 mL/min (ref >60.00)
GLUCOSE: 96 mg/dL (ref 70–99)
POTASSIUM: 3.7 meq/L (ref 3.5–5.1)
SODIUM: 136 meq/L (ref 135–145)

## 2014-06-28 LAB — HEMOGLOBIN A1C: HEMOGLOBIN A1C: 6.2 % (ref 4.6–6.5)

## 2014-07-01 MED ORDER — METFORMIN HCL 500 MG PO TABS: 750.0000 mg | ORAL_TABLET | Freq: Two times a day (BID) | ORAL | Status: DC

## 2014-07-01 NOTE — Telephone Encounter (Signed)
Pt called and is out of the metFORMIN (GLUCOPHAGE) 500 MG tablet 45 tablet 0 06/06/2014 Sig - Route: Take 1.5 tablets (750 mg total) by mouth twice daily.  Pt is out and needs enough to get through to her appointment on 07/08/14.  [facility].  Best number to call pt is [phone]

## 2014-07-01 NOTE — Addendum Note (Signed)
Addended by: Alfred LevinsWYRICK, CINDY D on: 07/01/2014 02:11 PM   Modules accepted: Orders

## 2014-07-01 NOTE — Telephone Encounter (Signed)
rx sent in electronically 

## 2014-07-02 ENCOUNTER — Other Ambulatory Visit: Payer: BC Managed Care – PPO

## 2014-07-08 ENCOUNTER — Ambulatory Visit (INDEPENDENT_AMBULATORY_CARE_PROVIDER_SITE_OTHER): Payer: BC Managed Care – PPO | Admitting: Internal Medicine

## 2014-07-08 ENCOUNTER — Encounter: Payer: Self-pay | Admitting: Internal Medicine

## 2014-07-08 VITALS — BP 102/60 | HR 84 | Temp 98.7°F | Ht 69.0 in | Wt 181.0 lb

## 2014-07-08 DIAGNOSIS — Z23 Encounter for immunization: Secondary | ICD-10-CM

## 2014-07-08 DIAGNOSIS — Z0000 Encounter for general adult medical examination without abnormal findings: Secondary | ICD-10-CM | POA: Insufficient documentation

## 2014-07-08 DIAGNOSIS — E119 Type 2 diabetes mellitus without complications: Secondary | ICD-10-CM

## 2014-07-08 MED ORDER — METFORMIN HCL 500 MG PO TABS: 750.0000 mg | ORAL_TABLET | Freq: Two times a day (BID) | ORAL | Status: DC

## 2014-07-08 NOTE — Assessment & Plan Note (Addendum)
Well controlled.  Patient encouraged to follow low saturated fat diet.  Educational material provided.  Obtain FLP, LFTs, TSH and HS CRP in 1 month.  Lab Results  Component Value Date   HGBA1C 6.2 06/28/2014   Continue metformin.

## 2014-07-08 NOTE — Patient Instructions (Signed)
Follow low saturated fat diet as directed Please complete the following lab tests in 1 month: FLP, LFTs,  TSH, HS CRP - 250.00 CBCD - V70 Microalb / cr ratio - 250.00

## 2014-07-08 NOTE — Assessment & Plan Note (Addendum)
Reviewed adult health maintenance protocols.  Patient updated with influenza vaccine and Pneumovax. Screening mammogram referral placed.  Continued annual Pap and pelvics with her GYN.  Screening colonoscopy starting age 45.

## 2014-07-08 NOTE — Progress Notes (Signed)
Pre visit review using our clinic review tool, if applicable. No additional management support is needed unless otherwise documented below in the visit note. 

## 2014-07-08 NOTE — Progress Notes (Signed)
Subjective:    Patient ID: Susan [name], female    DOB: 1971/02/02, 43 y.o.   MRN: [id number]  HPI  43 year old [place] female with history of mild type 2 diabetes for routine physical. Patient denies any significant interval medical history. Patient reports seeing an ophthalmologist in April of 2015. Diabetic eye exam reported normal.  Preventative health care-her last mammogram was in 2010. She is followed by her gynecologist for routine Pap and pelvics. She reports previous breast exam and self breast exams are normal. She denies family history of breast cancer.  Social history and family history reviewed and updated.  Review of Systems  Constitutional: Negative for activity change, appetite change and unexpected weight change.  Eyes: Negative for visual disturbance.  Respiratory: Negative for cough, chest tightness and shortness of breath.   Cardiovascular: Negative for chest pain.  Genitourinary: Negative for difficulty urinating.  Neurological: Negative for headaches.  Gastrointestinal: Negative for abdominal pain, heartburn melena or hematochezia Psych: Negative for depression or anxiety Endo:  No polyuria or polydypsia        Past Medical History  Diagnosis Date  . Depression   . Anxiety   . Obesity     History   Social History  . Marital Status: Single    Spouse Name: N/A    Number of Children: N/A  . Years of Education: N/A   Occupational History  . Not on file.   Social History Main Topics  . Smoking status: Never Smoker   . Smokeless tobacco: Not on file  . Alcohol Use: No  . Drug Use: No  . Sexual Activity: Not on file   Other Topics Concern  . Not on file   Social History Narrative  . No narrative on file    Past Surgical History  Procedure Laterality Date  . Tubal ligation    . Wisdom tooth cyst removed      Family History  Problem Relation Age of Onset  . Asthma Mother   . Hypertension Mother   . Diabetes Mother   .  Heart disease Father     CABG  . Asthma Father     Allergies  Allergen Reactions  . Divalproex Sodium     REACTION: itching, hives  . Duloxetine     REACTION: itching, hives  . Metformin And Related Rash    Current Outpatient Prescriptions on File Prior to Visit  Medication Sig Dispense Refill  . glucose blood (BAYER CONTOUR NEXT TEST) test strip Once daily or as instructed  100 each  3  . metFORMIN (GLUCOPHAGE) 500 MG tablet Take 1.5 tablets (750 mg total) by mouth 2 (two) times daily with a meal. Take one and half tab twice daily  45 tablet  0   No current facility-administered medications on file prior to visit.    BP 102/60  Pulse 84  Temp(Src) 98.7 F (37.1 C) (Oral)  Ht  (1.753 m)  Wt 181 lb (82.101 kg)  BMI 26.72 kg/m2    Objective:   Physical Exam  Constitutional: She is oriented to person, place, and time. She appears well-developed and well-nourished. No distress.  HENT:  Head: Normocephalic and atraumatic.  Right Ear: External ear normal.  Left Ear: External ear normal.  Mouth/Throat: Oropharynx is clear and moist.  Eyes: EOM are normal. Pupils are equal, round, and reactive to light.  Neck: Neck supple.  No carotid bruit  Cardiovascular: Normal rate, regular rhythm and normal heart sounds.  No murmur heard. Pulmonary/Chest: Breath sounds normal. No respiratory distress.  Abdominal: Soft. Bowel sounds are normal. She exhibits no distension and no mass.  Musculoskeletal: She exhibits no edema.  Lymphadenopathy:    She has no cervical adenopathy.  Neurological: She is alert and oriented to person, place, and time. No cranial nerve deficit.  Skin: Skin is warm and dry.  Psychiatric: She has a normal mood and affect. Her behavior is normal.          Assessment & Plan:

## 2014-07-10 ENCOUNTER — Ambulatory Visit (INDEPENDENT_AMBULATORY_CARE_PROVIDER_SITE_OTHER): Payer: Federal, State, Local not specified - PPO | Admitting: Family Medicine

## 2014-07-10 VITALS — BP 114/60 | HR 96 | Temp 99.0°F | Resp 18 | Ht 69.5 in | Wt 178.0 lb

## 2014-07-10 DIAGNOSIS — E119 Type 2 diabetes mellitus without complications: Secondary | ICD-10-CM

## 2014-07-10 DIAGNOSIS — R3129 Other microscopic hematuria: Secondary | ICD-10-CM

## 2014-07-10 DIAGNOSIS — R112 Nausea with vomiting, unspecified: Secondary | ICD-10-CM

## 2014-07-10 DIAGNOSIS — R197 Diarrhea, unspecified: Secondary | ICD-10-CM

## 2014-07-10 DIAGNOSIS — R109 Unspecified abdominal pain: Secondary | ICD-10-CM

## 2014-07-10 DIAGNOSIS — R509 Fever, unspecified: Secondary | ICD-10-CM

## 2014-07-10 LAB — POCT CBC
Granulocyte percent: 85.1 %G — AB (ref 37–80)
HEMATOCRIT: 33.4 % — AB (ref 37.7–47.9)
HEMOGLOBIN: 10.3 g/dL — AB (ref 12.2–16.2)
LYMPH, POC: 0.7 (ref 0.6–3.4)
MCH, POC: 28.3 pg (ref 27–31.2)
MCHC: 30.8 g/dL — AB (ref 31.8–35.4)
MCV: 91.8 fL (ref 80–97)
MID (cbc): 0.5 (ref 0–0.9)
MPV: 8.2 fL (ref 0–99.8)
POC GRANULOCYTE: 6.8 (ref 2–6.9)
POC LYMPH PERCENT: 9.1 %L — AB (ref 10–50)
POC MID %: 5.8 %M (ref 0–12)
Platelet Count, POC: 267 10*3/uL (ref 142–424)
RBC: 3.64 M/uL — AB (ref 4.04–5.48)
RDW, POC: 17.2 %
WBC: 8 10*3/uL (ref 4.6–10.2)

## 2014-07-10 LAB — POCT URINALYSIS DIPSTICK
BILIRUBIN UA: NEGATIVE
Glucose, UA: NEGATIVE
KETONES UA: NEGATIVE
Nitrite, UA: NEGATIVE
PH UA: 5
Protein, UA: NEGATIVE
Spec Grav, UA: <=1.005
Urobilinogen, UA: 0.2

## 2014-07-10 LAB — POCT UA - MICROSCOPIC ONLY
Casts, Ur, LPF, POC: NEGATIVE
Crystals, Ur, HPF, POC: NEGATIVE
MUCUS UA: NEGATIVE
Yeast, UA: NEGATIVE

## 2014-07-10 LAB — GLUCOSE, POCT (MANUAL RESULT ENTRY): POC GLUCOSE: 106 mg/dL — AB (ref 70–99)

## 2014-07-10 LAB — POCT URINE PREGNANCY: Preg Test, Ur: NEGATIVE

## 2014-07-10 MED ORDER — IBUPROFEN 600 MG PO TABS: 600.0000 mg | ORAL_TABLET | Freq: Three times a day (TID) | ORAL | Status: DC | PRN

## 2014-07-10 MED ORDER — ONDANSETRON HCL 4 MG/2ML IJ SOLN: 2.0000 mg | Freq: Once | INTRAMUSCULAR | Status: DC

## 2014-07-10 MED ORDER — CIPROFLOXACIN HCL 250 MG PO TABS: 500.0000 mg | ORAL_TABLET | Freq: Two times a day (BID) | ORAL | Status: DC

## 2014-07-10 MED ORDER — IBUPROFEN 200 MG PO TABS: 600.0000 mg | ORAL_TABLET | Freq: Once | ORAL | Status: DC

## 2014-07-10 MED ORDER — ONDANSETRON HCL 8 MG PO TABS: 8.0000 mg | ORAL_TABLET | Freq: Three times a day (TID) | ORAL | Status: DC | PRN

## 2014-07-10 NOTE — Progress Notes (Addendum)
[facility] [address] [place] [address] [phone]  Date:  07/10/2014   Name:  Susan [name]   DOB:  06-09-1971   MRN:  [id number]  PCP:  Thomos Lemons, DO    Chief Complaint: headaches, Generalized Body Aches, Chills, Emesis, Insomnia, Diarrhea and not eating   History of Present Illness:  Susan [name] is a 43 y.o. very pleasant female patient who presents with the following:  History of DM that has generally been under good control.  She is here today with illness that started yesterday.  She first noted that her stomach hurt.  She began vomiting last night- also diarrhea yesterday.  She has not been able to eat, has not been able to drink much fluid.  No diarrhea or vomiting today.  She still has abdominal pain in her central abdomen.  Her body aches all over.   No sick contacts, no suspicious foods.   No blood in her stool or vomit yesterday.    Patient Active Problem List   Diagnosis Date Noted  . Preventative health care 07/08/2014  . Food allergy 09/11/2012  . Type II or unspecified type diabetes mellitus without mention of complication, not stated as uncontrolled 07/31/2012  . Glucosuria 07/28/2012  . DEPRESSION 08/21/2008  . ANXIETY 02/28/2008    Past Medical History  Diagnosis Date  . Depression   . Anxiety   . Obesity   . Type II diabetes mellitus   . Anemia   . Arthritis     Past Surgical History  Procedure Laterality Date  . Tubal ligation    . Wisdom tooth cyst removed      History  Substance Use Topics  . Smoking status: Never Smoker   . Smokeless tobacco: Not on file  . Alcohol Use: No    Family History  Problem Relation Age of Onset  . Asthma Mother   . Hypertension Mother   . Diabetes Mother   . Heart disease Father     CABG  . Asthma Father   . Breast cancer Maternal Grandmother   . Colon cancer Neg Hx     Allergies  Allergen Reactions  . Divalproex Sodium     REACTION: itching, hives  . Duloxetine      REACTION: itching, hives  . Metformin And Related Rash    Medication list has been reviewed and updated.  Current Outpatient Prescriptions on File Prior to Visit  Medication Sig Dispense Refill  . glucose blood (BAYER CONTOUR NEXT TEST) test strip Once daily or as instructed  100 each  3  . metFORMIN (GLUCOPHAGE) 500 MG tablet Take 1.5 tablets (750 mg total) by mouth 2 (two) times daily with a meal.  45 tablet  11   No current facility-administered medications on file prior to visit.    Review of Systems:  As per HPI- otherwise negative.   Physical Examination: Filed Vitals:   07/10/14 1435  BP: 114/60  Pulse: 96  Temp: 99 F (37.2 C)  Resp: 18   Filed Vitals:   07/10/14 1435  Height: 5' 9.5" (1.765 m)  Weight: 178 lb (80.74 kg)   Body mass index is 25.92 kg/(m^2). Ideal Body Weight: Weight in (lb) to have BMI = 25: 171.4  GEN: WDWN, NAD, Non-toxic, A & O x 3, appears tired HEENT: Atraumatic, Normocephalic. Neck supple. No masses, No LAD.  Bilateral TM wnl, oropharynx normal.  PEERL,EOMI.   Ears and Nose: No external deformity.  CV: RRR, No M/G/R. No JVD. No thrill. No extra heart sounds. PULM: CTA B, no wheezes, crackles, rhonchi. No retractions. No resp. distress. No accessory muscle use. ABD: S, NT, ND, +BS. No rebound. No HSM.  Mild tenderness over her abdomen, not localized to one particular quadrant Pelvic: normal, no vaginal lesions or discharge. Uterus normal, no CMT, no adnexal tendereness or masses EXTR: No c/c/e NEURO Normal gait.  PSYCH: Normally interactive. Conversant. Not depressed or anxious appearing.  Calm demeanor.   Results for orders placed in visit on 07/10/14  POCT CBC      Result Value Ref Range   WBC 8.0  4.6 - 10.2 K/uL   Lymph, poc 0.7  0.6 - 3.4   POC LYMPH PERCENT 9.1 (*) 10 - 50 %L   MID (cbc) 0.5  0 - 0.9   POC MID % 5.8  0 - 12 %M   POC Granulocyte 6.8  2 - 6.9   Granulocyte percent 85.1 (*) 37 - 80 %G   RBC 3.64 (*) 4.04 -  5.48 M/uL   Hemoglobin 10.3 (*) 12.2 - 16.2 g/dL   HCT, POC 16.1 (*) 09.6 - 47.9 %   MCV 91.8  80 - 97 fL   MCH, POC 28.3  27 - 31.2 pg   MCHC 30.8 (*) 31.8 - 35.4 g/dL   RDW, POC 04.5     Platelet Count, POC 267  142 - 424 K/uL   MPV 8.2  0 - 99.8 fL  GLUCOSE, POCT (MANUAL RESULT ENTRY)      Result Value Ref Range   POC Glucose 106 (*) 70 - 99 mg/dl  POCT UA - MICROSCOPIC ONLY      Result Value Ref Range   WBC, Ur, HPF, POC 2-5     RBC, urine, microscopic 1-3     Bacteria, U Microscopic 1+     Mucus, UA neg     Epithelial cells, urine per micros 4-7     Crystals, Ur, HPF, POC neg     Casts, Ur, LPF, POC neg     Yeast, UA neg    POCT URINALYSIS DIPSTICK      Result Value Ref Range   Color, UA yellow     Clarity, UA clear     Glucose, UA neg     Bilirubin, UA neg     Ketones, UA neg     Spec Grav, UA <=1.005     Blood, UA small     pH, UA 5.0     Protein, UA neg     Urobilinogen, UA 0.2     Nitrite, UA neg     Leukocytes, UA Trace    POCT URINE PREGNANCY      Result Value Ref Range   Preg Test, Ur Negative     18 gauge IV placed right AC Given 2L of IV fluids,  of IV zofran and  of po ibuprfen. She then felt somewhat better but still had body aches, mild abdominal tenderness throughout.  Advised her that while a viral gastroenteritis seems most likely, also consider other etiology such as appendicitis.  Advised that we either transfer her to the ER for further evaluation or do an out-pt CT scan.  However at this time she declines both of these measures  Assessment and Plan: Nausea and vomiting, vomiting of unspecified type - Plan: POCT CBC, Comprehensive metabolic panel, ondansetron (ZOFRAN) injection 2 mg, ciprofloxacin (CIPRO) 250 MG tablet, ondansetron (ZOFRAN) 8 MG  tablet, DISCONTINUED: ondansetron (ZOFRAN) 8 MG tablet  Diarrhea  Type II or unspecified type diabetes mellitus without mention of complication, not stated as uncontrolled - Plan: POCT glucose  (manual entry)  Fever, unspecified - Plan: ibuprofen (ADVIL,MOTRIN) tablet 600 mg, ibuprofen (ADVIL,MOTRIN) 600 MG tablet, DISCONTINUED: ibuprofen (ADVIL,MOTRIN) 600 MG tablet  Abdominal pain, unspecified site - Plan: POCT UA - Microscopic Only, POCT urinalysis dipstick, POCT urine pregnancy, Urine culture  Likely viral gastroenteritis, consider other infectious enteritis and/ or UTI.  Less likely appendicitis, other more serious etiology.  She has declined further evaluation or imaging as above.  Will treat with cipro while we await her urine culture.  Cautioned regarding need for close follow-up if she is not feeling better in the next day or so- Sooner if worse.     Signed Abbe Amsterdam, MD  Called to check on her 8/27- she is feeling a lot better  Called again on 9/3- explained that her urine culture is inconclusive.  She did have up to 3 red cells per field/  It is possible that this could indicate bladder or kidney cancer, but these things are less likely given her age. She has smoked a few cigarettes over her life but is not a regular smoker.  Offered urology referral vs repeat UA and micro in a couple of weeks.  She would like to do a repeat urine and then follow-up as needed.  I will do orders for her .   LMP 06/20/14

## 2014-07-10 NOTE — Patient Instructions (Addendum)
Drink plenty of fluids and eat a bland diet (bananas, toast, crackers) as tolerated.  If you are not feeling better in the next couple of days or if you are getting worse please come and see [place] right away!  Take the cipro twice a day for 5 days for possible gastroenteritis or UTI.  I will be in touch with further labs

## 2014-07-11 LAB — COMPREHENSIVE METABOLIC PANEL
ALT: 11 U/L (ref 0–35)
AST: 18 U/L (ref 0–37)
Albumin: 4.3 g/dL (ref 3.5–5.2)
Alkaline Phosphatase: 56 U/L (ref 39–117)
BUN: 11 mg/dL (ref 6–23)
CALCIUM: 9.2 mg/dL (ref 8.4–10.5)
CHLORIDE: 102 meq/L (ref 96–112)
CO2: 24 meq/L (ref 19–32)
Creat: 0.71 mg/dL (ref 0.50–1.10)
GLUCOSE: 99 mg/dL (ref 70–99)
POTASSIUM: 3.7 meq/L (ref 3.5–5.3)
Sodium: 137 mEq/L (ref 135–145)
TOTAL PROTEIN: 7.3 g/dL (ref 6.0–8.3)
Total Bilirubin: 0.6 mg/dL (ref 0.2–1.2)

## 2014-07-11 LAB — URINE CULTURE: Colony Count: 30000

## 2014-07-15 ENCOUNTER — Telehealth: Payer: Self-pay | Admitting: Physician Assistant

## 2014-07-15 NOTE — Telephone Encounter (Signed)
Patient called reporting that she is unable to return to work today.  She included today on her time off request for work, so she doesn't need a new note, but wanted Dr. Patsy Lager to know that she wouldn't be going to work today.  If she feels well enough, she plans to go back tomorrow.  She reports nausea, no vomiting. Continues to have diarrhea.  Advised that if she is unable to RTW tomorrow, she should RTC for re-evaluation.

## 2014-07-16 ENCOUNTER — Ambulatory Visit (INDEPENDENT_AMBULATORY_CARE_PROVIDER_SITE_OTHER): Payer: Federal, State, Local not specified - PPO | Admitting: Emergency Medicine

## 2014-07-16 VITALS — BP 102/66 | HR 68 | Temp 98.3°F | Resp 18 | Ht 69.5 in | Wt 176.0 lb

## 2014-07-16 DIAGNOSIS — R197 Diarrhea, unspecified: Secondary | ICD-10-CM

## 2014-07-16 DIAGNOSIS — E119 Type 2 diabetes mellitus without complications: Secondary | ICD-10-CM

## 2014-07-16 LAB — COMPREHENSIVE METABOLIC PANEL
ALBUMIN: 4 g/dL (ref 3.5–5.2)
ALT: 14 U/L (ref 0–35)
AST: 18 U/L (ref 0–37)
Alkaline Phosphatase: 43 U/L (ref 39–117)
BUN: 7 mg/dL (ref 6–23)
CO2: 28 mEq/L (ref 19–32)
Calcium: 9.2 mg/dL (ref 8.4–10.5)
Chloride: 104 mEq/L (ref 96–112)
Creat: 0.76 mg/dL (ref 0.50–1.10)
Glucose, Bld: 90 mg/dL (ref 70–99)
POTASSIUM: 4.1 meq/L (ref 3.5–5.3)
SODIUM: 137 meq/L (ref 135–145)
TOTAL PROTEIN: 7.1 g/dL (ref 6.0–8.3)
Total Bilirubin: 0.3 mg/dL (ref 0.2–1.2)

## 2014-07-16 LAB — POCT CBC
Granulocyte percent: 63.7 %G (ref 37–80)
HCT, POC: 33.6 % — AB (ref 37.7–47.9)
HEMOGLOBIN: 10.6 g/dL — AB (ref 12.2–16.2)
LYMPH, POC: 2.3 (ref 0.6–3.4)
MCH: 28.8 pg (ref 27–31.2)
MCHC: 31.5 g/dL — AB (ref 31.8–35.4)
MCV: 91.4 fL (ref 80–97)
MID (cbc): 0.7 (ref 0–0.9)
MPV: 7.4 fL (ref 0–99.8)
POC GRANULOCYTE: 5.2 (ref 2–6.9)
POC LYMPH PERCENT: 28.1 %L (ref 10–50)
POC MID %: 8.2 %M (ref 0–12)
Platelet Count, POC: 314 10*3/uL (ref 142–424)
RBC: 3.68 M/uL — AB (ref 4.04–5.48)
RDW, POC: 16.7 %
WBC: 8.2 10*3/uL (ref 4.6–10.2)

## 2014-07-16 MED ORDER — LOPERAMIDE HCL 2 MG PO TABS: ORAL_TABLET | ORAL | Status: DC

## 2014-07-16 NOTE — Progress Notes (Signed)
[facility] [address], [place] [address] [phone]  Date:  07/16/2014   Name:  Susan [name]   DOB:  1971/04/01   MRN:  [id number]  PCP:  Thomos Lemons, DO    Chief Complaint: Diarrhea and Nausea   History of Present Illness:  Susan [name] is a 43 y.o. very pleasant female patient who presents with the following:  Seen last week with a presumed gastroenteritis and hydrated with fluids.  Was given cipro for a cystitis. Did well over weekend and now has recurrent diarrhea yesterday and today. Denies questionable food or water, no travel.  No fever but chills. The patient has no complaint of blood, mucous, or pus in her stools.  Feels as badly today as last week. Says sugar is under good control No improvement with over the counter medications or other home remedies. Denies other complaint or health concern today.   Patient Active Problem List   Diagnosis Date Noted  . Preventative health care 07/08/2014  . Food allergy 09/11/2012  . Type II or unspecified type diabetes mellitus without mention of complication, not stated as uncontrolled 07/31/2012  . Glucosuria 07/28/2012  . DEPRESSION 08/21/2008  . ANXIETY 02/28/2008    Past Medical History  Diagnosis Date  . Depression   . Anxiety   . Obesity   . Type II diabetes mellitus   . Anemia   . Arthritis     Past Surgical History  Procedure Laterality Date  . Tubal ligation    . Wisdom tooth cyst removed      History  Substance Use Topics  . Smoking status: Never Smoker   . Smokeless tobacco: Not on file  . Alcohol Use: No    Family History  Problem Relation Age of Onset  . Asthma Mother   . Hypertension Mother   . Diabetes Mother   . Heart disease Father     CABG  . Asthma Father   . Breast cancer Maternal Grandmother   . Colon cancer Neg Hx     Allergies  Allergen Reactions  . Divalproex Sodium     REACTION: itching, hives  . Duloxetine     REACTION: itching, hives  .  Metformin And Related Rash    Medication list has been reviewed and updated.  Current Outpatient Prescriptions on File Prior to Visit  Medication Sig Dispense Refill  . glucose blood (BAYER CONTOUR NEXT TEST) test strip Once daily or as instructed  100 each  3  . ibuprofen (ADVIL,MOTRIN) 600 MG tablet Take 1 tablet (600 mg total) by mouth every 8 (eight) hours as needed.  30 tablet  0  . metFORMIN (GLUCOPHAGE) 500 MG tablet Take 1.5 tablets (750 mg total) by mouth 2 (two) times daily with a meal.  45 tablet  11  . ondansetron (ZOFRAN) 8 MG tablet Take 1 tablet (8 mg total) by mouth every 8 (eight) hours as needed for nausea or vomiting.  15 tablet  0  . ciprofloxacin (CIPRO) 250 MG tablet Take 2 tablets (500 mg total) by mouth 2 (two) times daily.  20 tablet  0   Current Facility-Administered Medications on File Prior to Visit  Medication Dose Route Frequency Provider Last Rate Last Dose  . ibuprofen (ADVIL,MOTRIN) tablet 600 mg  600 mg Oral Once [name], MD      . ondansetron (ZOFRAN) injection 2 mg  2 mg Intravenous Once Pearline Cables, MD  Review of Systems:  As per HPI, otherwise negative.    Physical Examination: Filed Vitals:   07/16/14 1435  BP: 102/66  Pulse: 68  Temp: 98.3 F (36.8 C)  Resp: 18   Filed Vitals:   07/16/14 1435  Height: 5' 9.5" (1.765 m)  Weight: 176 lb (79.833 kg)   Body mass index is 25.63 kg/(m^2). Ideal Body Weight: Weight in (lb) to have BMI = 25: 171.4  GEN: WDWN, NAD, Non-toxic, A & O x 3 HEENT: Atraumatic, Normocephalic. Neck supple. No masses, No LAD. Ears and Nose: No external deformity. CV: RRR, No M/G/R. No JVD. No thrill. No extra heart sounds. PULM: CTA B, no wheezes, crackles, rhonchi. No retractions. No resp. distress. No accessory muscle use. ABD: S, NT, ND, +BS. No rebound. No HSM. EXTR: No c/c/e NEURO Normal gait.  PSYCH: Normally interactive. Conversant. Not depressed or anxious appearing.  Calm demeanor.     Assessment and Plan: Diarrhea Stool studies  Signed,  Phillips Odor, MD   Results for orders placed in visit on 07/16/14  POCT CBC      Result Value Ref Range   WBC 8.2  4.6 - 10.2 K/uL   Lymph, poc 2.3  0.6 - 3.4   POC LYMPH PERCENT 28.1  10 - 50 %L   MID (cbc) 0.7  0 - 0.9   POC MID % 8.2  0 - 12 %M   POC Granulocyte 5.2  2 - 6.9   Granulocyte percent 63.7  37 - 80 %G   RBC 3.68 (*) 4.04 - 5.48 M/uL   Hemoglobin 10.6 (*) 12.2 - 16.2 g/dL   HCT, POC 16.1 (*) 09.6 - 47.9 %   MCV 91.4  80 - 97 fL   MCH, POC 28.8  27 - 31.2 pg   MCHC 31.5 (*) 31.8 - 35.4 g/dL   RDW, POC 04.5     Platelet Count, POC 314  142 - 424 K/uL   MPV 7.4  0 - 99.8 fL

## 2014-07-16 NOTE — Patient Instructions (Signed)

## 2014-07-17 ENCOUNTER — Telehealth: Payer: Self-pay

## 2014-07-17 NOTE — Telephone Encounter (Signed)
Pt dropped off FMLA ppw on 7/31 and put into Dr. Adele Schilder box on 06/16/14 for completion in 5-7 business days. Date of onset 78/26/15. Upon completion please return to the disability box by check out at 102 so that complete forms can be scanned into Epic by [place] or [place].    For Jasmine and Lauren Only:  Pt has paid $15 fee Needs document faxed to [phone] Wants self copy as well Ph: [phone] Requested completion on  9/14

## 2014-07-18 NOTE — Addendum Note (Signed)
Addended by: Abbe Amsterdam C on: 07/18/2014 04:12 PM   Modules accepted: Orders

## 2014-07-18 NOTE — Addendum Note (Signed)
Addended by: Abbe Amsterdam C on: 07/18/2014 04:13 PM   Modules accepted: Orders

## 2014-07-18 NOTE — Telephone Encounter (Signed)
Called her- she was out from 07/10/14 and plans to return tomorrow night 07/19/14 from her GI illness.  Did paperwork and will fax for her, and mail her a copy

## 2014-07-19 ENCOUNTER — Telehealth: Payer: Self-pay | Admitting: *Deleted

## 2014-07-19 ENCOUNTER — Other Ambulatory Visit (INDEPENDENT_AMBULATORY_CARE_PROVIDER_SITE_OTHER): Payer: Federal, State, Local not specified - PPO | Admitting: *Deleted

## 2014-07-19 ENCOUNTER — Other Ambulatory Visit: Payer: Self-pay | Admitting: Emergency Medicine

## 2014-07-19 ENCOUNTER — Encounter: Payer: Self-pay | Admitting: Family Medicine

## 2014-07-19 DIAGNOSIS — R3129 Other microscopic hematuria: Secondary | ICD-10-CM

## 2014-07-19 DIAGNOSIS — Z0271 Encounter for disability determination: Secondary | ICD-10-CM

## 2014-07-19 LAB — POCT URINALYSIS DIPSTICK
Bilirubin, UA: NEGATIVE
GLUCOSE UA: NEGATIVE
KETONES UA: NEGATIVE
Leukocytes, UA: NEGATIVE
Nitrite, UA: NEGATIVE
Protein, UA: NEGATIVE
RBC UA: NEGATIVE
Spec Grav, UA: 1.01
Urobilinogen, UA: 0.2
pH, UA: 7.5

## 2014-07-19 LAB — POCT UA - MICROSCOPIC ONLY
BACTERIA, U MICROSCOPIC: NEGATIVE
CASTS, UR, LPF, POC: NEGATIVE
CRYSTALS, UR, HPF, POC: NEGATIVE
MUCUS UA: NEGATIVE
RBC, urine, microscopic: NEGATIVE
WBC, Ur, HPF, POC: NEGATIVE
Yeast, UA: NEGATIVE

## 2014-07-19 NOTE — Telephone Encounter (Signed)
Pt came in to have repeat urinalysis done. There was also a cbc ordered that was however not done at the time; as the urine was brought into the lab with the chart. Would you like for [place] to call this pt back in to the office to have this drawn since it was overlooked?

## 2014-07-19 NOTE — Progress Notes (Signed)
Pt here for lab only visit for urinalysis to be done

## 2014-07-20 LAB — C. DIFFICILE GDH AND TOXIN A/B
C. DIFFICILE GDH: NOT DETECTED
C. difficile Toxin A/B: NOT DETECTED

## 2014-07-23 LAB — OVA AND PARASITE EXAMINATION: OP: NONE SEEN

## 2014-07-23 LAB — STOOL CULTURE

## 2014-07-31 ENCOUNTER — Ambulatory Visit: Admission: RE | Admit: 2014-07-31 | Payer: Federal, State, Local not specified - PPO | Source: Ambulatory Visit

## 2014-07-31 ENCOUNTER — Other Ambulatory Visit: Payer: Self-pay | Admitting: Internal Medicine

## 2014-07-31 ENCOUNTER — Ambulatory Visit
Admission: RE | Admit: 2014-07-31 | Discharge: 2014-07-31 | Disposition: A | Discharge status: Home / Self Care | Payer: Federal, State, Local not specified - PPO | Source: Ambulatory Visit | Attending: Internal Medicine | Admitting: Internal Medicine

## 2014-07-31 DIAGNOSIS — Z1231 Encounter for screening mammogram for malignant neoplasm of breast: Secondary | ICD-10-CM

## 2014-08-07 ENCOUNTER — Ambulatory Visit (INDEPENDENT_AMBULATORY_CARE_PROVIDER_SITE_OTHER): Payer: Federal, State, Local not specified - PPO | Admitting: Family Medicine

## 2014-08-07 VITALS — BP 108/62 | HR 74 | Temp 98.0°F | Resp 17 | Ht 69.0 in | Wt 182.0 lb

## 2014-08-07 DIAGNOSIS — N898 Other specified noninflammatory disorders of vagina: Secondary | ICD-10-CM

## 2014-08-07 DIAGNOSIS — E119 Type 2 diabetes mellitus without complications: Secondary | ICD-10-CM

## 2014-08-07 DIAGNOSIS — N760 Acute vaginitis: Secondary | ICD-10-CM

## 2014-08-07 DIAGNOSIS — A499 Bacterial infection, unspecified: Secondary | ICD-10-CM

## 2014-08-07 DIAGNOSIS — R109 Unspecified abdominal pain: Secondary | ICD-10-CM

## 2014-08-07 DIAGNOSIS — B9689 Other specified bacterial agents as the cause of diseases classified elsewhere: Secondary | ICD-10-CM

## 2014-08-07 DIAGNOSIS — G44209 Tension-type headache, unspecified, not intractable: Secondary | ICD-10-CM

## 2014-08-07 LAB — POCT UA - MICROSCOPIC ONLY
Casts, Ur, LPF, POC: NEGATIVE
Crystals, Ur, HPF, POC: NEGATIVE
MUCUS UA: NEGATIVE
YEAST UA: NEGATIVE

## 2014-08-07 LAB — POCT CBC
GRANULOCYTE PERCENT: 52.3 % (ref 37–80)
HCT, POC: 32.2 % — AB (ref 37.7–47.9)
Hemoglobin: 10.5 g/dL — AB (ref 12.2–16.2)
Lymph, poc: 3.2 (ref 0.6–3.4)
MCH, POC: 29.6 pg (ref 27–31.2)
MCHC: 32.4 g/dL (ref 31.8–35.4)
MCV: 91.1 fL (ref 80–97)
MID (CBC): 0.6 (ref 0–0.9)
MPV: 7.8 fL (ref 0–99.8)
PLATELET COUNT, POC: 273 10*3/uL (ref 142–424)
POC Granulocyte: 4.1 (ref 2–6.9)
POC LYMPH PERCENT: 40.5 %L (ref 10–50)
POC MID %: 7.2 % (ref 0–12)
RBC: 3.53 M/uL — AB (ref 4.04–5.48)
RDW, POC: 17.9 %
WBC: 7.8 10*3/uL (ref 4.6–10.2)

## 2014-08-07 LAB — COMPREHENSIVE METABOLIC PANEL
ALT: 10 U/L (ref 0–35)
AST: 18 U/L (ref 0–37)
Albumin: 4.4 g/dL (ref 3.5–5.2)
Alkaline Phosphatase: 52 U/L (ref 39–117)
BUN: 11 mg/dL (ref 6–23)
CHLORIDE: 103 meq/L (ref 96–112)
CO2: 29 meq/L (ref 19–32)
CREATININE: 0.75 mg/dL (ref 0.50–1.10)
Calcium: 9.6 mg/dL (ref 8.4–10.5)
Glucose, Bld: 89 mg/dL (ref 70–99)
POTASSIUM: 3.9 meq/L (ref 3.5–5.3)
SODIUM: 137 meq/L (ref 135–145)
Total Bilirubin: 0.5 mg/dL (ref 0.2–1.2)
Total Protein: 7.5 g/dL (ref 6.0–8.3)

## 2014-08-07 LAB — POCT URINALYSIS DIPSTICK
Bilirubin, UA: NEGATIVE
Blood, UA: NEGATIVE
Glucose, UA: NEGATIVE
KETONES UA: NEGATIVE
Leukocytes, UA: NEGATIVE
NITRITE UA: NEGATIVE
PH UA: 5
PROTEIN UA: NEGATIVE
Spec Grav, UA: <=1.005
Urobilinogen, UA: 0.2

## 2014-08-07 LAB — POCT WET PREP WITH KOH
KOH Prep POC: NEGATIVE
Trichomonas, UA: NEGATIVE
Yeast Wet Prep HPF POC: NEGATIVE

## 2014-08-07 LAB — GLUCOSE, POCT (MANUAL RESULT ENTRY): POC Glucose: 110 mg/dl — AB (ref 70–99)

## 2014-08-07 LAB — POCT URINE PREGNANCY: Preg Test, Ur: NEGATIVE

## 2014-08-07 MED ORDER — METHOCARBAMOL 500 MG PO TABS: 500.0000 mg | ORAL_TABLET | Freq: Every evening | ORAL | Status: DC | PRN

## 2014-08-07 MED ORDER — METRONIDAZOLE 500 MG PO TABS
500.0000 mg | ORAL_TABLET | Freq: Two times a day (BID) | ORAL | Status: DC
Start: 2014-08-07 — End: 2014-11-28

## 2014-08-07 NOTE — Progress Notes (Signed)
[facility] [address], [place] [address] [phone]  Date:  08/07/2014   Name:  Susan [name]   DOB:  Oct 16, 1971   MRN:  [id number]  PCP:  Thomos Lemons, DO    Chief Complaint: Abdominal Pain and Headache   History of Present Illness:  Susan [name] is a 43 y.o. very pleasant female patient who presents with the following:  She has abdominal pain; has been seen for this a couple of times over the last month.   She wonders if bacterial vaginosis may be the cause.  Notes "a burning sensation" in her stomach. Says it might go away for a couple of days then it will come back. No vomiting and has had a normal appetite. No diarrhea or pain with urination. Notes that she does have some vaginal discharge but no smell that she noticed. No fever or cough. Has regular BMs. No known weight changes. She feels that her abdomen can be bloated at times, but this gets better after a BM.   She also notes some pain and tension in the back of her head and into her neck.  Something this will cause a headache and neck pain.  She has noted this for a month or more.  "It's stress from my job."  Patient Active Problem List   Diagnosis Date Noted  . Preventative health care 07/08/2014  . Food allergy 09/11/2012  . Type II or unspecified type diabetes mellitus without mention of complication, not stated as uncontrolled 07/31/2012  . Glucosuria 07/28/2012  . DEPRESSION 08/21/2008  . ANXIETY 02/28/2008    Past Medical History  Diagnosis Date  . Depression   . Anxiety   . Obesity   . Type II diabetes mellitus   . Anemia   . Arthritis     Past Surgical History  Procedure Laterality Date  . Tubal ligation    . Wisdom tooth cyst removed      History  Substance Use Topics  . Smoking status: Never Smoker   . Smokeless tobacco: Not on file  . Alcohol Use: No    Family History  Problem Relation Age of Onset  . Asthma Mother   . Hypertension Mother   . Diabetes Mother    . Heart disease Father     CABG  . Asthma Father   . Breast cancer Maternal Grandmother   . Colon cancer Neg Hx     Allergies  Allergen Reactions  . Divalproex Sodium     REACTION: itching, hives  . Duloxetine     REACTION: itching, hives  . Metformin And Related Rash    Medication list has been reviewed and updated.  Current Outpatient Prescriptions on File Prior to Visit  Medication Sig Dispense Refill  . glucose blood (BAYER CONTOUR NEXT TEST) test strip Once daily or as instructed  100 each  3  . ibuprofen (ADVIL,MOTRIN) 600 MG tablet Take 1 tablet (600 mg total) by mouth every 8 (eight) hours as needed.  30 tablet  0  . metFORMIN (GLUCOPHAGE) 500 MG tablet Take 1.5 tablets (750 mg total) by mouth 2 (two) times daily with a meal.  45 tablet  11  . ondansetron (ZOFRAN) 8 MG tablet Take 1 tablet (8 mg total) by mouth every 8 (eight) hours as needed for nausea or vomiting.  15 tablet  0   Current Facility-Administered Medications on File Prior to Visit  Medication Dose Route Frequency Provider Last Rate Last  Dose  . ibuprofen (ADVIL,MOTRIN) tablet 600 mg  600 mg Oral Once Gwenlyn Found Dewell Monnier, MD      . ondansetron (ZOFRAN) injection 2 mg  2 mg Intravenous Once Gwenlyn Found Daegen Berrocal, MD        Review of Systems:  As per HPI- otherwise negative.   Physical Examination: Filed Vitals:   08/07/14 0823  BP: 108/62  Pulse: 74  Temp: 98 F (36.7 C)  Resp: 17   Filed Vitals:   08/07/14 0823  Height:  (1.753 m)  Weight: 182 lb (82.555 kg)   Body mass index is 26.86 kg/(m^2). Ideal Body Weight: Weight in (lb) to have BMI = 25: 168.9  GEN: WDWN, NAD, Non-toxic, A & O x 3, mild overweight, looks well HEENT: Atraumatic, Normocephalic. Neck supple. No masses, No LAD.  Bilateral TM wnl, oropharynx normal.  PEERL,EOMI.   Some tension and tenderness in her trapezius muscle Ears and Nose: No external deformity. CV: RRR, No M/G/R. No JVD. No thrill. No extra heart  sounds. PULM: CTA B, no wheezes, crackles, rhonchi. No retractions. No resp. distress. No accessory muscle use. ABD: S, NT, ND, +BS. No rebound. No HSM.  Benign exam EXTR: No c/c/e NEURO Normal gait.  PSYCH: Normally interactive. Conversant. Not depressed or anxious appearing.  Calm demeanor.  Pelvic: normal, no vaginal lesions or discharge. Uterus normal, no CMT, no adnexal tendereness or masses   Results for orders placed in visit on 08/07/14  POCT UA - MICROSCOPIC ONLY      Result Value Ref Range   WBC, Ur, HPF, POC 1-2     RBC, urine, microscopic 0-1     Bacteria, U Microscopic small     Mucus, UA neg     Epithelial cells, urine per micros 2-4     Crystals, Ur, HPF, POC neg     Casts, Ur, LPF, POC neg     Yeast, UA neg    POCT URINALYSIS DIPSTICK      Result Value Ref Range   Color, UA yellow     Clarity, UA clear     Glucose, UA neg     Bilirubin, UA neg     Ketones, UA neg     Spec Grav, UA <=1.005     Blood, UA neg     pH, UA 5.0     Protein, UA neg     Urobilinogen, UA 0.2     Nitrite, UA neg     Leukocytes, UA Negative    POCT URINE PREGNANCY      Result Value Ref Range   Preg Test, Ur Negative    POCT WET PREP WITH KOH      Result Value Ref Range   Trichomonas, UA Negative     Clue Cells Wet Prep HPF POC 50%     Epithelial Wet Prep HPF POC 3-20     Yeast Wet Prep HPF POC neg     Bacteria Wet Prep HPF POC 3+     RBC Wet Prep HPF POC 1-2     WBC Wet Prep HPF POC 2-4     KOH Prep POC Negative    GLUCOSE, POCT (MANUAL RESULT ENTRY)      Result Value Ref Range   POC Glucose 110 (*) 70 - 99 mg/dl  POCT CBC      Result Value Ref Range   WBC 7.8  4.6 - 10.2 K/uL   Lymph, poc 3.2  0.6 - 3.4  POC LYMPH PERCENT 40.5  10 - 50 %L   MID (cbc) 0.6  0 - 0.9   POC MID % 7.2  0 - 12 %M   POC Granulocyte 4.1  2 - 6.9   Granulocyte percent 52.3  37 - 80 %G   RBC 3.53 (*) 4.04 - 5.48 M/uL   Hemoglobin 10.5 (*) 12.2 - 16.2 g/dL   HCT, POC 16.1 (*) 09.6 - 47.9 %    MCV 91.1  80 - 97 fL   MCH, POC 29.6  27 - 31.2 pg   MCHC 32.4  31.8 - 35.4 g/dL   RDW, POC 04.5     Platelet Count, POC 273  142 - 424 K/uL   MPV 7.8  0 - 99.8 fL    Assessment and Plan: Abdominal pain, other specified site - Plan: POCT UA - Microscopic Only, POCT urinalysis dipstick, POCT urine pregnancy, POCT CBC, Comprehensive metabolic panel, GC/Chlamydia Probe Amp, Urine culture  Vaginal discharge - Plan: POCT Wet Prep with KOH, GC/Chlamydia Probe Amp  Type II or unspecified type diabetes mellitus without mention of complication, not stated as uncontrolled - Plan: POCT glucose (manual entry), Comprehensive metabolic panel  BV (bacterial vaginosis) - Plan: metroNIDAZOLE (FLAGYL) 500 MG tablet  Tension headache - Plan: methocarbamol (ROBAXIN) 500 MG tablet  Here for the 3rd time in a month with somewhat vague abdominal complaints.  Although she does appear to have BV explained that BV does not generally cause abdominal pain.  Strongly encouraged her to undergo an ultrasound of her abdomen and pelvis, and consider a CT depending on results.  She continues to decline imagine, but will let me know if not feeling better soon- Sooner if worse.   Treat BV with flagyl    Signed Abbe Amsterdam, MD

## 2014-08-07 NOTE — Patient Instructions (Signed)
We are going to treat you for BV with flagyl twice a day for one week.  If you do not feel better please let me know.

## 2014-08-08 ENCOUNTER — Encounter: Payer: Self-pay | Admitting: Family Medicine

## 2014-08-08 LAB — URINE CULTURE: Colony Count: 45000

## 2014-08-08 LAB — GC/CHLAMYDIA PROBE AMP
CT PROBE, AMP APTIMA: NEGATIVE
GC Probe RNA: NEGATIVE

## 2014-09-05 ENCOUNTER — Telehealth: Payer: Self-pay | Admitting: Radiology

## 2014-09-05 NOTE — Telephone Encounter (Signed)
Patient called and asked for Morrie Sheldonshley the Production designer, theatre/television/filmmanager.  Advised patient she was looking for Marylene Landngela and she is in a meeting.   Patient stated she has been trying to resolve this billing issue for a week.  She would like a call back today. Put note on Colgatengela desk..    [phone]

## 2014-09-05 NOTE — Telephone Encounter (Signed)
I have spoken to patient she is upset she was tested for STD's and is not sexually active. I have explained to her STD testing is part of the routine exam for females with abdominal pain. I have further advised her often females who have STD's may not have symptoms, it is recommended physicians routinely check for these. Patient states she is angry she has gotten bills for tests she feels like she did not need done, I am forwarding this message to you, so you are aware.

## 2014-09-05 NOTE — Telephone Encounter (Signed)
I stand by my decision to do a gonorrhea/ chlamydia test for this patient.  I would do this test in all females with abdominal pain due to the risk of PID, a potentially very dangerous condition. I do not rely on a patient's history regarding sexual activity because I do not know if a patient might be afraid or embarrassed to give an accurate history, or might be the victim of abuse or infidelity.

## 2014-09-05 NOTE — Telephone Encounter (Signed)
Patient indicates she wants to talk to Marylene Landngela about this, since patient is upset about the charges.

## 2014-09-16 NOTE — Telephone Encounter (Signed)
I have discussed with Susan [name] and patient as well. She needed the tests, as her symptoms were abdominal pain and vaginal discharge. Patient disagrees. I have advised her there is nothing further I can do to help with this.

## 2014-10-02 ENCOUNTER — Encounter (HOSPITAL_BASED_OUTPATIENT_CLINIC_OR_DEPARTMENT_OTHER): Payer: Self-pay

## 2014-10-02 ENCOUNTER — Emergency Department (HOSPITAL_BASED_OUTPATIENT_CLINIC_OR_DEPARTMENT_OTHER)
Admission: EM | Admit: 2014-10-02 | Discharge: 2014-10-02 | Disposition: A | Discharge status: Home / Self Care | Payer: Federal, State, Local not specified - PPO | Attending: Emergency Medicine | Admitting: Emergency Medicine

## 2014-10-02 DIAGNOSIS — E119 Type 2 diabetes mellitus without complications: Secondary | ICD-10-CM | POA: Insufficient documentation

## 2014-10-02 DIAGNOSIS — E6609 Other obesity due to excess calories: Secondary | ICD-10-CM | POA: Diagnosis not present

## 2014-10-02 DIAGNOSIS — Z79899 Other long term (current) drug therapy: Secondary | ICD-10-CM | POA: Diagnosis not present

## 2014-10-02 DIAGNOSIS — F430 Acute stress reaction: Secondary | ICD-10-CM

## 2014-10-02 DIAGNOSIS — F410 Panic disorder [episodic paroxysmal anxiety] without agoraphobia: Secondary | ICD-10-CM | POA: Diagnosis not present

## 2014-10-02 DIAGNOSIS — Z862 Personal history of diseases of the blood and blood-forming organs and certain disorders involving the immune mechanism: Secondary | ICD-10-CM | POA: Insufficient documentation

## 2014-10-02 DIAGNOSIS — R0602 Shortness of breath: Secondary | ICD-10-CM | POA: Diagnosis present

## 2014-10-02 DIAGNOSIS — M1990 Unspecified osteoarthritis, unspecified site: Secondary | ICD-10-CM | POA: Insufficient documentation

## 2014-10-02 MED ORDER — LORAZEPAM 1 MG PO TABS: 1.0000 mg | ORAL_TABLET | Freq: Every evening | ORAL | Status: DC | PRN

## 2014-10-02 NOTE — ED Notes (Signed)
Pt refused EKG when MD told her we would get one.

## 2014-10-02 NOTE — ED Notes (Signed)
Pt c/o congestion with nonproductive cough x2wks, states tonight felt nauseated and "couldn't breath"; pt states has been stressed out a lot, anxiety making SOB worse

## 2014-10-02 NOTE — Discharge Instructions (Signed)

## 2014-10-02 NOTE — ED Provider Notes (Addendum)
CSN: [id number]     Arrival date & time 10/02/14  0118 History   First MD Initiated Contact with Patient 10/02/14 0133     Chief Complaint  Patient presents with  . Nasal Congestion     (Consider location/radiation/quality/duration/timing/severity/associated sxs/prior Treatment) HPI  This 43 year old female with a history of depression and anxiety. She is currently seeing someone for her depression but is not on any medications for this. She states she has been under a lot of stress recently. She was at work just prior to arrival when she developed the sudden onset of anxiety, shortness of breath and rapid heartbeat. She attributes these symptoms to stress at work. There was no associated chest pain, diaphoresis or paresthesias. She did feel nauseated. These symptoms resolved on their own. She has had about 2 weeks of cough but states this is not severe.  Past Medical History  Diagnosis Date  . Depression   . Anxiety   . Obesity   . Type II diabetes mellitus   . Anemia   . Arthritis    Past Surgical History  Procedure Laterality Date  . Tubal ligation    . Wisdom tooth cyst removed     Family History  Problem Relation Age of Onset  . Asthma Mother   . Hypertension Mother   . Diabetes Mother   . Heart disease Father     CABG  . Asthma Father   . Breast cancer Maternal Grandmother   . Colon cancer Neg Hx    History  Substance Use Topics  . Smoking status: Never Smoker   . Smokeless tobacco: Not on file  . Alcohol Use: No   OB History    No data available     Review of Systems  All other systems reviewed and are negative.   Allergies  Divalproex sodium and Duloxetine  Home Medications   Prior to Admission medications   Medication Sig Start Date End Date Taking? Authorizing Provider  glucose blood (BAYER CONTOUR NEXT TEST) test strip Once daily or as instructed 08/03/13   Doe-Hyun R Artist PaisYoo, DO  ibuprofen (ADVIL,MOTRIN) 600 MG tablet Take 1 tablet (600 mg total) by  mouth every 8 (eight) hours as needed. 07/10/14   Pearline CablesJessica C Copland, MD  metFORMIN (GLUCOPHAGE) 500 MG tablet Take 1.5 tablets (750 mg total) by mouth 2 (two) times daily with a meal. 07/08/14   Doe-Hyun R Artist PaisYoo, DO  methocarbamol (ROBAXIN) 500 MG tablet Take 1 tablet (500 mg total) by mouth at bedtime as needed for muscle spasms. 08/07/14   Gwenlyn FoundJessica C Copland, MD  metroNIDAZOLE (FLAGYL) 500 MG tablet Take 1 tablet (500 mg total) by mouth 2 (two) times daily. Take 1 pill twice daily for one week. NO alcohol 08/07/14   Pearline CablesJessica C Copland, MD  ondansetron (ZOFRAN) 8 MG tablet Take 1 tablet (8 mg total) by mouth every 8 (eight) hours as needed for nausea or vomiting. 07/10/14   Gwenlyn FoundJessica C Copland, MD   BP 118/71 mmHg  Pulse 71  Temp(Src) 97.9 F (36.6 C) (Oral)  Resp 20  SpO2 100%  LMP 09/25/2014   Physical Exam  General: Well-developed, well-nourished female in no acute distress; appearance consistent with age of record HENT: normocephalic; atraumatic; no nasal congestion Eyes: pupils equal, round and reactive to light; extraocular muscles intact Neck: supple Heart: regular rate and rhythm Lungs: clear to auscultation bilaterally Abdomen: soft; nondistended; nontender; no masses or hepatosplenomegaly; bowel sounds present Extremities: No deformity; full range of motion; pulses  normal Neurologic: Awake, alert and oriented; motor function intact in all extremities and symmetric; no facial droop Skin: Warm and dry Psychiatric: mildly anxious; flat affect    ED Course  Procedures (including critical care time)   MDM  1:44 AM Patient refuses an EKG and wishes to be discharged. She states she did want come to the ED but her employer made her. Symptoms are consistent with a panic attack and she was advised to follow-up with her mental health provider.    Carlisle BeersJohn L Ashelynn Marks, MD 10/02/14 [phone]  05/07/2015 History of present illness modified to indicate the patient attributed her stress to  work.   Paula LibraJohn Avin Upperman, MD 05/07/15 [phone]

## 2014-11-28 ENCOUNTER — Encounter: Payer: Self-pay | Admitting: *Deleted

## 2014-11-28 ENCOUNTER — Ambulatory Visit (INDEPENDENT_AMBULATORY_CARE_PROVIDER_SITE_OTHER): Payer: 59 | Admitting: Family Medicine

## 2014-11-28 ENCOUNTER — Ambulatory Visit: Payer: Federal, State, Local not specified - PPO | Admitting: Family Medicine

## 2014-11-28 ENCOUNTER — Encounter: Payer: Self-pay | Admitting: Family Medicine

## 2014-11-28 VITALS — BP 112/70 | HR 72 | Ht 69.0 in | Wt 194.0 lb

## 2014-11-28 DIAGNOSIS — K529 Noninfective gastroenteritis and colitis, unspecified: Secondary | ICD-10-CM

## 2014-11-28 DIAGNOSIS — F32A Depression, unspecified: Secondary | ICD-10-CM

## 2014-11-28 DIAGNOSIS — F329 Major depressive disorder, single episode, unspecified: Secondary | ICD-10-CM

## 2014-11-28 DIAGNOSIS — F411 Generalized anxiety disorder: Secondary | ICD-10-CM

## 2014-11-28 DIAGNOSIS — G43009 Migraine without aura, not intractable, without status migrainosus: Secondary | ICD-10-CM

## 2014-11-28 NOTE — Progress Notes (Addendum)
HPI:  Migraines: -reports dx with migraines a long time ago and saw a neurologist remotely, then migraines improved -recurrence of headaches for > 6 months, unchanged in severity or frequency in last 6 months -thinks related to stress - setting up appt to see psych for the stress and depression, denies any SI or thoughts of self harm, reports majority of stress is from work  -symptoms: mod achy pain in band around head, accompanied by sensitivity to light and sound and occ with nausea and rarely vomiting -has had HAs daily for almost 6 months -uses tramadol or ibuprofen daily for her headaches - these don't help, reports has tried many medications in the past including triptans, antiemetics, IM tx and others and nothing works  -wants referral to neurologist  -denies: fevers, weight loss, vision changes, weakness  ROS: See pertinent positives and negatives per HPI.  Past Medical History  Diagnosis Date  . Depression   . Anxiety   . Obesity   . Type II diabetes mellitus   . Anemia   . Arthritis     Past Surgical History  Procedure Laterality Date  . Tubal ligation    . Wisdom tooth cyst removed      Family History  Problem Relation Age of Onset  . Asthma Mother   . Hypertension Mother   . Diabetes Mother   . Heart disease Father     CABG  . Asthma Father   . Breast cancer Maternal Grandmother   . Colon cancer Neg Hx     History   Social History  . Marital Status: Single    Spouse Name: N/A    Number of Children: N/A  . Years of Education: N/A   Social History Main Topics  . Smoking status: Never Smoker   . Smokeless tobacco: None  . Alcohol Use: No  . Drug Use: No  . Sexual Activity: None   Other Topics Concern  . 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  glucose blood (BAYER CONTOUR NEXT TEST) test strip, Once daily or as instructed, Disp: 100 each, Rfl: 3 .  ibuprofen  (ADVIL,MOTRIN) 600 MG tablet, Take 1 tablet (600 mg total) by mouth every 8 (eight) hours as needed., Disp: 30 tablet, Rfl: 0 .  LORazepam (ATIVAN) 1 MG tablet, Take 1 tablet (1 mg total) by mouth at bedtime as needed for anxiety or sleep., Disp: 10 tablet, Rfl: 0 .  metFORMIN (GLUCOPHAGE) 500 MG tablet, Take 1.5 tablets (750 mg total) by mouth 2 (two) times daily with a meal., Disp: 45 tablet, Rfl: 11 .  methocarbamol (ROBAXIN) 500 MG tablet, Take 1 tablet (500 mg total) by mouth at bedtime as needed for muscle spasms., Disp: 30 tablet, Rfl: 0 .  ondansetron (ZOFRAN) 8 MG tablet, Take 1 tablet (8 mg total) by mouth every 8 (eight) hours as needed for nausea or vomiting., Disp: 15 tablet, Rfl: 0  Current facility-administered medications:  .  ibuprofen (ADVIL,MOTRIN) tablet 600 mg, 600 mg, Oral, Once, [name], MD .  ondansetron (ZOFRAN) injection 2 mg, 2 mg, Intravenous, Once, Pearline CablesJessica C Copland, MD  EXAMCeasar Mons:  Filed Vitals:   11/28/14 1618  BP: 112/70  Pulse: 72    Body mass index is 28.64 kg/(m^2).  GENERAL: vitals reviewed and listed above, alert, oriented, appears well hydrated and in no acute distress  HEENT: atraumatic, conjunttiva clear,  PERRLA, EOMI, visual acuity grossly intact, no obvious abnormalities on inspection of external nose and ears, no temp artery TTP or bruit  NECK: no obvious masses on inspection, no bruit  LUNGS: clear to auscultation bilaterally, no wheezes, rales or rhonchi, good air movement  CV: HRRR, no peripheral edema  MS: moves all extremities without noticeable abnormality  PSYCH: flat affect  NEURO: CN II-XII grossly intact, finger to nose normal  ASSESSMENT AND PLAN:  Discussed the following assessment and plan:  Migraine without aura and without status migrainosus, not intractable - Plan: Ambulatory referral to Neurology  Anxiety state  Depression  Gastroenteritis  -hx of migraines and per her report failure of many various  treatment options, normal neuro exam -advised suspect recurrence of her migraines given hx but advised imaging given change in migraines to ensure dx -we discussed many different treatment options both abortive and prophylactic for migraines and she reports they do not work -she wants to see a neurologist and a referral was placed; she opted to do imaging if neuro advised -trial Excedrin in interim as she reports she has not tried this recently  -advised to limit use of analgesics to less the twice weekly and explained risks of daily chronic rebound HA - may be a component of this in current HA patter -tramadol not helping and advised against continuing this -advised migraine journal, topical menthol and tx of her mental health concerns - she reports she will be seeing [facility] and I advised her that I believe they have a walk in clinic if she needs treatment sooner -as leaving she asked for work note from 2 days ago until tomorrow as reports had stomach bug on top of migraines - note provided -Patient advised to return or notify a doctor immediately if symptoms worsen or persist or new concerns arise. Advised routine f/u with PCP   Patient Instructions  Keep a headaches journal   We placed a referral for you as discussed to the neurologist. It usually takes about 1-2 weeks to process and schedule this referral. If you have not heard from [place] regarding this appointment in 2 weeks please contact our office.  Try excedrine migraine - do not use more then twice per week; also can try topical menthol products  Please seek care with your psychiatrist to help with stress  Follow up with your doctor in 1-2 months or as needed     Susan [name], [facility] R.

## 2014-11-28 NOTE — Patient Instructions (Signed)
Keep a headaches journal   We placed a referral for you as discussed to the neurologist. It usually takes about 1-2 weeks to process and schedule this referral. If you have not heard from us regarding this appointment in 2 weeks please contact our office.  Try excedrine migraine - do not use more then twice per week; also can try topical menthol products  Please seek care with your psychiatrist to help with stress  Follow up with your doctor in 1-2 months or as needed

## 2014-11-28 NOTE — Progress Notes (Signed)
Pre visit review using our clinic review tool, if applicable. No additional management support is needed unless otherwise documented below in the visit note. 

## 2015-01-09 ENCOUNTER — Encounter: Payer: Self-pay | Admitting: *Deleted

## 2015-01-14 ENCOUNTER — Ambulatory Visit (INDEPENDENT_AMBULATORY_CARE_PROVIDER_SITE_OTHER): Payer: 59 | Admitting: Neurology

## 2015-01-14 ENCOUNTER — Encounter: Payer: Self-pay | Admitting: Neurology

## 2015-01-14 VITALS — BP 102/60 | HR 80 | Temp 98.2°F | Resp 18 | Ht 69.0 in | Wt 199.7 lb

## 2015-01-14 DIAGNOSIS — M542 Cervicalgia: Secondary | ICD-10-CM

## 2015-01-14 DIAGNOSIS — F329 Major depressive disorder, single episode, unspecified: Secondary | ICD-10-CM

## 2015-01-14 DIAGNOSIS — F32A Depression, unspecified: Secondary | ICD-10-CM

## 2015-01-14 DIAGNOSIS — G43719 Chronic migraine without aura, intractable, without status migrainosus: Secondary | ICD-10-CM

## 2015-01-14 MED ORDER — NORTRIPTYLINE HCL 25 MG PO CAPS: ORAL_CAPSULE | ORAL | Status: DC

## 2015-01-14 MED ORDER — SUMATRIPTAN SUCCINATE 100 MG PO TABS: ORAL_TABLET | ORAL | Status: DC

## 2015-01-14 MED ORDER — TIZANIDINE HCL 2 MG PO TABS: 2.0000 mg | ORAL_TABLET | Freq: Three times a day (TID) | ORAL | Status: DC | PRN

## 2015-01-14 NOTE — Patient Instructions (Signed)
1.  Start nortriptyline 25mg  at bedtime for 7 days, then increase to 50mg  (2 capsules) at bedtime.  Call in 4 weeks with update. 2.  For neck pain, try tizanidine 2mg .  May take no more than every 8 hours as needed.  I am only giving you 30 pills. 3.  At earliest onset of severe headache, try sumatriptan 100mg .  May repeat once in 2 hours if needed.  Do not exceed more than 2 pills in 24 hours. 4.  Must see counselor for depression and anxiety.  May benefit from cognitive behavioral therapy so ask the counselor about this 5.  Follow up in 3 months.  CALL IN 4 WEEKS WITH UPDATE

## 2015-01-14 NOTE — Progress Notes (Signed)
NEUROLOGY CONSULTATION NOTE  Susan [name] MRN: [id number] DOB: 05/16/1971  Referring provider: Dr. Selena Batten Primary care provider: Dr. Artist Pais  Reason for consult:  Migraine.  HISTORY OF PRESENT ILLNESS: Susan [name] is a 44 year old right-handed woman with type II diabetes, anxiety, depression and migraine who presents for migraine.  Records reviewed.  Onset:  Has had migraines on and off for many years.  This episode recurred in June 2015, related to work-stressors. Location:  Bi-frontal and into the neck Quality:  Throbbing/aching Intensity:  7/10 (10/10 when severe) Aura:  no Prodrome:  no Associated symptoms:  Nausea, photophobia, phonophobia, osmophobia, blurred vision. Duration:  Constant but when severe, it lasts 3 days Frequency:  Constant, but severe attacks occur 3 days per week. Triggers/exacerbating factors:  Smells of spicy foods, [place] food, [place] food, stress Relieving factors:  none Activity:  Feels that she needs to remain in bed for 4-5 days per week but forces self to get up to care for her son.  Has missed work all of February.  Past abortive therapy:  Excedrin migraine, ibuprofen, Aleve, Tylenol, tramadol, promethazine, Imitrex (tried it many years ago), Robaxin for neck pain Past preventative therapy:  None.  Previously was on Cymbalta for depression, which caused itching, chiropractor for neck pain (ineffective)  Current abortive therapy:  none Current preventative therapy:  none Other medications:  Robaxin , Zofran for nausea (gastroenteritis)  Caffeine:  Coffee or tea 3 days a week at most Alcohol:  no Smoker:  no Diet:  Good.  Stays hydrated. Exercise:  Walks daily Depression/stress:  Significant depression and anxiety.  Significant stress related to work.  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  She was on the night shift for many years and was granted the day shift, which  made her feel better.  Then they put her on the night shift again and that is when the headaches and neck pain started.  She is currently in a lawsuit with her employer. Sleep hygiene:  Poor. Family history of headache:  no  PAST MEDICAL HISTORY: Past Medical History  Diagnosis Date  . Depression   . Anxiety   . Obesity   . Type II diabetes mellitus   . Anemia   . Arthritis   . Headache     PAST SURGICAL HISTORY: Past Surgical History  Procedure Laterality Date  . Tubal ligation    . Wisdom tooth cyst removed      MEDICATIONS: Current Outpatient Prescriptions on File Prior to Visit  Medication Sig Dispense Refill  . glucose blood (BAYER CONTOUR NEXT TEST) test strip Once daily or as instructed 100 each 3  . ibuprofen (ADVIL,MOTRIN) 600 MG tablet Take 1 tablet (600 mg total) by mouth every 8 (eight) hours as needed. (Patient not taking: Reported on 01/14/2015) 30 tablet 0  . LORazepam (ATIVAN) 1 MG tablet Take 1 tablet (1 mg total) by mouth at bedtime as needed for anxiety or sleep. (Patient not taking: Reported on 01/14/2015) 10 tablet 0  . metFORMIN (GLUCOPHAGE) 500 MG tablet Take 1.5 tablets (750 mg total) by mouth 2 (two) times daily with a meal. 45 tablet 11  . methocarbamol (ROBAXIN) 500 MG tablet Take 1 tablet (500 mg total) by mouth at bedtime as needed for muscle spasms. (Patient not taking: Reported on 01/14/2015) 30 tablet 0  . ondansetron (ZOFRAN) 8 MG tablet Take 1 tablet (8 mg total) by mouth  every 8 (eight) hours as needed for nausea or vomiting. (Patient not taking: Reported on 01/14/2015) 15 tablet 0   Current Facility-Administered Medications on File Prior to Visit  Medication Dose Route Frequency Provider Last Rate Last Dose  . ibuprofen (ADVIL,MOTRIN) tablet 600 mg  600 mg Oral Once [name], MD      . ondansetron (ZOFRAN) injection 2 mg  2 mg Intravenous Once Pearline Cables, MD        ALLERGIES: Allergies  Allergen Reactions  . Divalproex Sodium      REACTION: itching, hives  . Duloxetine     REACTION: itching, hives    FAMILY HISTORY: Family History  Problem Relation Age of Onset  . Asthma Mother   . Hypertension Mother   . Diabetes Mother   . Heart disease Father     CABG  . Asthma Father   . Breast cancer Maternal Grandmother   . Colon cancer Neg Hx     SOCIAL HISTORY: History   Social History  . Marital Status: Single    Spouse Name: N/A  . Number of Children: N/A  . Years of Education: N/A   Occupational History  . Not on file.   Social History Main Topics  . Smoking status: Former Games developer  . Smokeless tobacco: Never Used  . Alcohol Use: No  . Drug Use: No  . Sexual Activity: No   Other Topics Concern  . 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    REVIEW OF SYSTEMS: Constitutional: No fevers, chills, or sweats, no generalized fatigue, change in appetite Eyes: No visual changes, double vision, eye pain Ear, nose and throat: No hearing loss, ear pain, nasal congestion, sore throat Cardiovascular: No chest pain, palpitations Respiratory:  No shortness of breath at rest or with exertion, wheezes GastrointestinaI: No nausea, vomiting, diarrhea, abdominal pain, fecal incontinence Genitourinary:  No dysuria, urinary retention or frequency Musculoskeletal:  No neck pain, back pain Integumentary: No rash, pruritus, skin lesions Neurological: as above Psychiatric: No depression, insomnia, anxiety Endocrine: No palpitations, fatigue, diaphoresis, mood swings, change in appetite, change in weight, increased thirst Hematologic/Lymphatic:  No anemia, purpura, petechiae. Allergic/Immunologic: no itchy/runny eyes, nasal congestion, recent allergic reactions, rashes  PHYSICAL EXAM: Filed Vitals:   01/14/15 0827  BP: 102/60  Pulse: 80  Temp: 98.2 F (36.8 C)  Resp: 18   General: No acute distress Head:  Normocephalic/atraumatic Eyes:  fundi unremarkable, without  vessel changes, exudates, hemorrhages or papilledema. Neck: supple, bilateral paraspinal tenderness, reduced range of motion with head turn to left, up and down. Back: No paraspinal tenderness Heart: regular rate and rhythm Lungs: Clear to auscultation bilaterally. Vascular: No carotid bruits. Neurological Exam: Mental status: alert and oriented to person, place, and time, recent and remote memory intact, fund of knowledge intact, attention and concentration intact, speech fluent and not dysarthric, language intact. Cranial nerves: CN I: not tested CN II: pupils equal, round and reactive to light, visual fields intact, fundi unremarkable, without vessel changes, exudates, hemorrhages or papilledema. CN III, IV, VI:  full range of motion, no nystagmus, no ptosis CN V: facial sensation intact CN VII: upper and lower face symmetric CN VIII: hearing intact CN IX, X: gag intact, uvula midline CN XI: sternocleidomastoid and trapezius muscles intact CN XII: tongue midline Bulk & Tone: normal, no fasciculations. Motor:  5/5 throughout Sensation:  Pinprick and vibration intact Deep Tendon Reflexes:  2+  throughout, toes downgoing Finger to nose testing:  No dysmetria Heel to shin:  No dysmetria Gait:  Normal station and stride.  Able to turn and walk in tandem. Romberg negative.  IMPRESSION: Chronic migraine without aura, intractable Depression Neck pain  Chronic migraine triggered by stress and neck pain related to stress.    PLAN: 1.  Will start nortriptyline 25mg  at bedtime for 7 days, then increase to 50mg  at bedtime to address headache and depression 2.  Will prescribe tizanidine 2mg  as needed for neck pain. 3.  For abortive therapy for severe headache, will retry sumatriptan as it has been many years since she last took it.  100mg . 4.  Has appointment to see a counselor to address depression and anxiety.  Would benefit from cognitive behavioral therapy. 5.  Ideally, she should not  work night shift/third shift, as chronic migrainers typically don't do well if they remain on this schedule 6.  Follow up in 3 months.  Advised to call in 4 weeks with update.  Thank you for allowing me to take part in the care of this patient.  Shon MilletAdam Elysha Daw, DO  CC:  Kriste BasqueHannah Kim, DO  Thomos Lemonsobert Yoo, MD

## 2015-01-15 ENCOUNTER — Telehealth: Payer: Self-pay | Admitting: Neurology

## 2015-01-15 NOTE — Telephone Encounter (Signed)
Pt called and states that she talked with you about getting her report from yesterday visit with Dr Everlena CooperJaffe and she would like that mail to her please

## 2015-01-16 ENCOUNTER — Ambulatory Visit (INDEPENDENT_AMBULATORY_CARE_PROVIDER_SITE_OTHER): Payer: 59 | Admitting: Family Medicine

## 2015-01-16 ENCOUNTER — Encounter: Payer: Self-pay | Admitting: Family Medicine

## 2015-01-16 VITALS — BP 98/60 | HR 88 | Temp 97.7°F | Wt 197.1 lb

## 2015-01-16 DIAGNOSIS — G43809 Other migraine, not intractable, without status migrainosus: Secondary | ICD-10-CM

## 2015-01-16 NOTE — Progress Notes (Signed)
Pre visit review using our clinic review tool, if applicable. No additional management support is needed unless otherwise documented below in the visit note. 

## 2015-01-16 NOTE — Progress Notes (Addendum)
HPI:  Susan [name] is a 44 yo F patient of Susan [name] with a PMH sig for migraines, anxiety and depression here for an acute visit for  HA: -migraines for many years, reports worse for 1 year today -seen here for acute visit in 11/2014 and discussed prophylactic and abortive tx but she was not interested in anything and opted to see neurology -saw neurologist 2 days ago -having headache today and did not go to work today, similar to prior headaches -here today to get work note for today and requests work note for ov to neurologist and is upset neurologist did not provide work note or order to be out of work -denies: change in headaches, fevers, neck stiffness, vomiting, vision changes -she just started nortriptyline yesterday, reports she has not taken the triptan at all, she also did not take the tizanadine - discussed in neurology plan  ROS: See pertinent positives and negatives per HPI.  Past Medical History  Diagnosis Date  . Depression   . Anxiety   . Obesity   . Type II diabetes mellitus   . Anemia   . Arthritis   . Headache     Past Surgical History  Procedure Laterality Date  . Tubal ligation    . Wisdom tooth cyst removed      Family History  Problem Relation Age of Onset  . Asthma Mother   . Hypertension Mother   . Diabetes Mother   . Heart disease Father     CABG  . Asthma Father   . Breast cancer Maternal Grandmother   . Colon cancer Neg Hx     History   Social History  . Marital Status: Single    Spouse Name: N/A  . Number of Children: N/A  . Years of Education: N/A   Social History Main Topics  . Smoking status: Former Games developermoker  . Smokeless tobacco: Never Used  . Alcohol Use: No  . Drug Use: No  . Sexual Activity: No   Other Topics Concern  . 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  glucose blood (BAYER CONTOUR NEXT TEST) test strip, Once daily or as instructed,  Disp: 100 each, Rfl: 3 .  LORazepam (ATIVAN) 1 MG tablet, Take 1 tablet (1 mg total) by mouth at bedtime as needed for anxiety or sleep., Disp: 10 tablet, Rfl: 0 .  metFORMIN (GLUCOPHAGE) 500 MG tablet, Take 1.5 tablets (750 mg total) by mouth 2 (two) times daily with a meal., Disp: 45 tablet, Rfl: 11 .  methocarbamol (ROBAXIN) 500 MG tablet, Take 1 tablet (500 mg total) by mouth at bedtime as needed for muscle spasms., Disp: 30 tablet, Rfl: 0 .  ondansetron (ZOFRAN) 8 MG tablet, Take 1 tablet (8 mg total) by mouth every 8 (eight) hours as needed for nausea or vomiting., Disp: 15 tablet, Rfl: 0 .  tiZANidine (ZANAFLEX) 2 MG tablet, Take 1 tablet (2 mg total) by mouth every 8 (eight) hours as needed for muscle spasms., Disp: 30 tablet, Rfl: 0 .  ibuprofen (ADVIL,MOTRIN) 600 MG tablet, Take 1 tablet (600 mg total) by mouth every 8 (eight) hours as needed. (Patient not taking: Reported on 01/14/2015), Disp: 30 tablet, Rfl: 0 .  nortriptyline (PAMELOR) 25 MG capsule, Take 1cap at bedtime x7d, then 2caps at bedtime (Patient not taking: Reported on 01/16/2015), Disp: 60 capsule, Rfl: 0 .  SUMAtriptan (  IMITREX) 100 MG tablet, Take 1tab at earliest onset of headache.  May repeat once in 2 hours if headache persists or recurs. (Patient not taking: Reported on 01/16/2015), Disp: 10 tablet, Rfl: 2  Current facility-administered medications:  .  ibuprofen (ADVIL,MOTRIN) tablet 600 mg, 600 mg, Oral, Once, Susan C Copland, MD .  ondansetron (ZOFRAN) injection 2 mg, 2 mg, Intravenous, Once, Susan Found Copland, MD  EXAM:  Filed Vitals:   01/16/15 1629  BP: 98/60  Pulse: 88  Temp: 97.7 F (36.5 C)    Body mass index is 29.09 kg/(m^2).  GENERAL: vitals reviewed and listed above, alert, oriented, appears well hydrated and in no acute distress  HEENT: atraumatic, conjunttiva clear, PER, no obvious abnormalities on inspection of external nose and ears  NECK: no obvious masses on inspection  MS: moves all  extremities without noticeable abnormality; normal gait  PSYCH: flat afect  ASSESSMENT AND PLAN:  Discussed the following assessment and plan:  Other migraine without status migrainosus, not intractable  -letter for OV today for work provided; noted neurology OV date on letter as well per her request -she has not tried the treatments recommended by her neurologist -advised she try the treatments recommended by her neurologist and advised that it will take some time for the TCA to work -advised follow up with neurologist if treatments not helping -advised any further follow up and recommendations and letters or paperwork for missing work for migraines come from neurologist or PCP -counseling/psych for help with stress per her plan from last visit; TCA may help as well -Patient advised to return or notify a doctor immediately if symptoms worsen or persist or new concerns arise.  Patient Instructions  I hope you start feeling better soon. Please try the medications advised by your neurologist and follow up with neurologist if not feeling better.  Follow up with your neurologist or Primary doctor regarding your headaches and any further work dismissal or FMLA.  Please schedule counseling.      Susan Basque R.

## 2015-01-16 NOTE — Patient Instructions (Addendum)
I hope you start feeling better soon. Please try the medications advised by your neurologist and follow up with neurologist if not feeling better.  Follow up with your neurologist or Primary doctor regarding your headaches and any further work dismissal or FMLA for work  Please schedule counseling.

## 2015-01-16 NOTE — Telephone Encounter (Signed)
Letter mailed 01/16/15 per patient request office visit note

## 2015-02-04 ENCOUNTER — Encounter (HOSPITAL_COMMUNITY): Payer: Self-pay | Admitting: Psychiatry

## 2015-02-04 ENCOUNTER — Ambulatory Visit (INDEPENDENT_AMBULATORY_CARE_PROVIDER_SITE_OTHER): Payer: PRIVATE HEALTH INSURANCE | Admitting: Psychiatry

## 2015-02-04 VITALS — BP 126/71 | HR 81 | Ht 68.0 in | Wt 197.4 lb

## 2015-02-04 DIAGNOSIS — F339 Major depressive disorder, recurrent, unspecified: Secondary | ICD-10-CM | POA: Diagnosis not present

## 2015-02-04 DIAGNOSIS — F331 Major depressive disorder, recurrent, moderate: Secondary | ICD-10-CM

## 2015-02-04 MED ORDER — NORTRIPTYLINE HCL 75 MG PO CAPS: 75.0000 mg | ORAL_CAPSULE | Freq: Every day | ORAL | Status: DC
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
## 2015-02-14 ENCOUNTER — Telehealth: Payer: Self-pay | Admitting: Neurology

## 2015-02-14 ENCOUNTER — Telehealth: Payer: Self-pay | Admitting: *Deleted

## 2015-02-14 NOTE — Telephone Encounter (Signed)
Patient called stating that Dr Arfeen increased her pamelor Lolly Mustachefrom 50 mg HS to 75 mg HS on March 22 she states it is still not helping. She is not taking the Imitrex she states she told Dr Everlena CooperJaffe that pill has never helped . I explained to her that sometimes it could take up to 4 weeks for the medication to start to help she states she has an appt.  with Dr Lolly MustacheArfeen next week .

## 2015-02-16 NOTE — Telephone Encounter (Signed)
error 

## 2015-02-18 ENCOUNTER — Encounter (HOSPITAL_COMMUNITY): Payer: Self-pay | Admitting: Psychiatry

## 2015-02-18 ENCOUNTER — Ambulatory Visit (INDEPENDENT_AMBULATORY_CARE_PROVIDER_SITE_OTHER): Payer: PRIVATE HEALTH INSURANCE | Admitting: Psychiatry

## 2015-02-18 VITALS — BP 107/70 | HR 89 | Ht 68.0 in | Wt 199.8 lb

## 2015-02-18 DIAGNOSIS — F331 Major depressive disorder, recurrent, moderate: Secondary | ICD-10-CM | POA: Diagnosis not present
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
## 2015-03-03 ENCOUNTER — Telehealth: Payer: Self-pay | Admitting: Neurology

## 2015-03-03 NOTE — Telephone Encounter (Signed)
Pt called to report her neck and head are still hurting. Please call / Sherri S. [phone]

## 2015-03-04 NOTE — Telephone Encounter (Signed)
I have called the patient X3  And left messages

## 2015-03-05 ENCOUNTER — Telehealth (HOSPITAL_COMMUNITY): Payer: Self-pay | Admitting: *Deleted

## 2015-03-05 NOTE — Telephone Encounter (Signed)
LMOM for patient to call back.  Appointment scheduled on 03-20-15 needs to be rescheduled per Coletta MemosSylvia H., Front Desk Team Lead

## 2015-03-07 ENCOUNTER — Telehealth (HOSPITAL_COMMUNITY): Payer: Self-pay | Admitting: *Deleted

## 2015-03-07 NOTE — Telephone Encounter (Signed)
LMOM for patient to call back.  Appointment scheduled on 03-20-15 needs to be rescheduled per Sylvia H., Front Desk Team Lead  

## 2015-03-12 ENCOUNTER — Ambulatory Visit (HOSPITAL_COMMUNITY): Payer: Self-pay | Admitting: Clinical

## 2015-03-12 ENCOUNTER — Telehealth (HOSPITAL_COMMUNITY): Payer: Self-pay | Admitting: *Deleted

## 2015-03-12 NOTE — Telephone Encounter (Signed)
Dr. Lolly MustacheArfeen,   Patient called and left message on nurse's vm.  Patient is requesting refill on Nortriptyline.  Patient last seen 02-18-15. Patient due to follow up 04-01-15.  Medication filled on 02-04-15 with zero refills.  Is it okay to refill?

## 2015-03-13 ENCOUNTER — Other Ambulatory Visit (HOSPITAL_COMMUNITY): Payer: Self-pay | Admitting: Psychiatry

## 2015-03-13 DIAGNOSIS — F331 Major depressive disorder, recurrent, moderate: Secondary | ICD-10-CM

## 2015-03-13 MED ORDER — NORTRIPTYLINE HCL 75 MG PO CAPS: 75.0000 mg | ORAL_CAPSULE | Freq: Every day | ORAL | Status: DC

## 2015-03-17 NOTE — Telephone Encounter (Signed)
Dose: 75 mg Route: Oral Frequency: Daily at bedtime   Dispense Quantity:  30 capsule Refills:  0 Fills Remaining:  0          Sig: Take 1 capsule (75 mg total) by mouth at bedtime.         Written Date:  03/13/15 Expiration Date:  03/12/16     Start Date:  03/13/15 End Date:  --     Ordering Provider:  -- Authorizing Provider:  Cleotis NipperSyed T Arfeen, MD Ordering User:  Cleotis NipperSyed T Arfeen, MD          Diagnosis Association: Major depressive disorder, recurrent episode, moderate (296.32)              Original Order:  nortriptyline (PAMELOR) 75 MG capsule [[id number]][[id number]]        Pharmacy:  [facility] - [place], [place] - [address]      Pharmacy Comments:  --         Quantity Remaining:  0 capsule Quantity Filled:  0 capsule

## 2015-03-20 ENCOUNTER — Ambulatory Visit (HOSPITAL_COMMUNITY): Payer: Self-pay | Admitting: Psychiatry

## 2015-04-01 ENCOUNTER — Ambulatory Visit (HOSPITAL_COMMUNITY): Payer: Self-pay | Admitting: Psychiatry
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
## 2015-04-16 ENCOUNTER — Ambulatory Visit (HOSPITAL_COMMUNITY): Payer: Self-pay | Admitting: Clinical

## 2015-04-16 ENCOUNTER — Ambulatory Visit (HOSPITAL_COMMUNITY): Payer: Self-pay | Admitting: Psychiatry

## 2015-04-17 ENCOUNTER — Ambulatory Visit (INDEPENDENT_AMBULATORY_CARE_PROVIDER_SITE_OTHER): Payer: 59 | Admitting: Neurology

## 2015-04-17 ENCOUNTER — Ambulatory Visit (INDEPENDENT_AMBULATORY_CARE_PROVIDER_SITE_OTHER): Payer: PRIVATE HEALTH INSURANCE | Admitting: Psychiatry

## 2015-04-17 ENCOUNTER — Encounter: Payer: Self-pay | Admitting: Neurology

## 2015-04-17 ENCOUNTER — Encounter (HOSPITAL_COMMUNITY): Payer: Self-pay | Admitting: Psychiatry

## 2015-04-17 VITALS — BP 125/65 | HR 75 | Ht 68.0 in | Wt 209.2 lb

## 2015-04-17 VITALS — BP 118/66 | HR 70 | Resp 16 | Ht 68.0 in | Wt 208.9 lb

## 2015-04-17 DIAGNOSIS — G43819 Other migraine, intractable, without status migrainosus: Secondary | ICD-10-CM | POA: Diagnosis not present

## 2015-04-17 DIAGNOSIS — F39 Unspecified mood [affective] disorder: Secondary | ICD-10-CM

## 2015-04-17 DIAGNOSIS — F331 Major depressive disorder, recurrent, moderate: Secondary | ICD-10-CM

## 2015-04-17 MED ORDER — VENLAFAXINE HCL ER 37.5 MG PO CP24: ORAL_CAPSULE | ORAL | Status: DC

## 2015-04-17 NOTE — Patient Instructions (Addendum)
1.  Start venlafaxine ER 37.5mg  capsule.  Take 1 capsule at bedtime for 7 days, then increase to 2 capsules at bedtime.  CALL IN 4 WEEKS WITH UPDATE AND FOR REFILL. 2.  Refer to Dr. Katrinka BlazingSmith for neck pain 3.  Will write letter to recommend not working third shift. 4.  Follow up in 3 months. 5.  Will provide information regarding Botox.

## 2015-04-17 NOTE — Progress Notes (Signed)
[facility] [id number] Progress Note  Susan [name] [id number] 44 y.o.  04/17/2015 4:54 PM  Chief Complaint:  My neurologist change the medication.  He started me on Effexor.     History of Present Illness:  Susan [name] came for her follow-up appointment.  She saw Susan [name] for headache and her medicine is changed.  She is no longer taking nortriptyline and she is going to start Effexor and titration dose.  Patient continued to stressed about her job.  She is working night shift however she is hoping that led her from her neurologist may help who is recommending to work daytime.  Patient was unable to find therapist through [facility] and she is scheduled to see Susan [name] for counseling in this office.  Patient denies any irritability, crying spells, suicidal thoughts or homicidal thought.  She admitted chronic fatigue and feeling tired due to headaches.  Patient denies any paranoia or any hallucination.  Patient also want her records and she was given all her records.  After reviewing the initial evaluation records she mentioned that her employer agreed to give daytime job and after 4 months rather than 2 months it was switched back to night shift.  Patient wants to make correction on today's visit.  Patient denies drinking or using any illegal substances.  She has no tremors or shakes.  She is hoping Effexor will help her depression, headaches and changing her shift time today may help her stress level .  She is hoping to see a therapist in this office.  Her sleep is good.  Her appetite is okay.  Her vitals are stable.  Patient lives with her 46 year old son.  Suicidal Ideation: No Plan Formed: No Patient has means to carry out plan: No  Homicidal Ideation: No Plan Formed: No Patient has means to carry out plan: No  Past Psychiatric History/Hospitalization(s) Patient denies any previous history of psychiatric inpatient treatment or any suicidal attempt.  In 2009 she has seen Dr. Emerson MonteParrish [name] and  Susan [name] for depression which she believed due to job-related.  She tried Cymbalta, Lamictal, Depakote, imipramine, trazodone and Paxil in the past but does not believe any medicine helped her.  Recently Wellbutrin was given by her primary care physician which also did not help.  Patient denies any history of psychosis, mania, hallucination or any PTSD symptoms.  Anxiety: Yes Bipolar Disorder: No Depression: Yes Mania: No Psychosis: No Schizophrenia: No Personality Disorder: No Hospitalization for psychiatric illness: No History of Electroconvulsive Shock Therapy: No Prior Suicide Attempts: No  Medical History; Patient has headaches, neck pain,  nausea.  Her neurologist Susan [name].  Patient denies any history of seizures.      Review of Systems  Cardiovascular: Negative for chest pain and palpitations.  Neurological: Positive for headaches. Negative for dizziness and tremors.  Psychiatric/Behavioral: Positive for depression.    Psychiatric: Agitation: No Hallucination: No Depressed Mood: Yes Insomnia: No Hypersomnia: No Altered Concentration: No Feels Worthless: No Grandiose Ideas: No Belief In Special Powers: No New/Increased Substance Abuse: No Compulsions: No  Neurologic: Headache: Yes Seizure: No Paresthesias: No   Musculoskeletal: Strength & Muscle Tone: within normal limits Gait & Station: normal Patient leans: N/A   Outpatient Encounter Prescriptions as of 04/17/2015  Medication Sig  . glucose blood (BAYER CONTOUR NEXT TEST) test strip Once daily or as instructed  . metFORMIN (GLUCOPHAGE) 500 MG tablet Take 1.5 tablets (750 mg total) by mouth 2 (two) times daily with a meal.  . methocarbamol (ROBAXIN) 500  MG tablet Take 1 tablet (500 mg total) by mouth at bedtime as needed for muscle spasms. (Patient not taking: Reported on 02/18/2015)  . nortriptyline (PAMELOR) 75 MG capsule Take 1 capsule (75 mg total) by mouth at bedtime. (Patient not taking: Reported on  04/17/2015)  . SUMAtriptan (IMITREX) 100 MG tablet Take 1tab at earliest onset of headache.  May repeat once in 2 hours if headache persists or recurs. (Patient not taking: Reported on 04/17/2015)  . tiZANidine (ZANAFLEX) 2 MG tablet Take 1 tablet (2 mg total) by mouth every 8 (eight) hours as needed for muscle spasms. (Patient not taking: Reported on 04/17/2015)  . venlafaxine XR (EFFEXOR-XR) 37.5 MG 24 hr capsule Take 1cap at bedtime for 7 days, then increase to 2 caps at bedtime.   Facility-Administered Encounter Medications as of 04/17/2015  Medication  . ibuprofen (ADVIL,MOTRIN) tablet 600 mg    No results found for this or any previous visit (from the past 2160 hour(s)).    Constitutional:  BP 125/65 mmHg  Pulse 75  Ht  (1.727 m)  Wt 209 lb 3.2 oz (94.892 kg)  BMI 31.82 kg/m2  LMP 04/10/2015 (Exact Date)   Mental Status Examination;  Patient is casually dressed and fairly groomed.  She maintained fair eye contact. She described her mood sad depressed and her affect is constricted.  Her speech is slow but clear and coherent with normal volume and tone.  Her thought process logical and goal-directed .  She denies any active or passive suicidal thoughts or homicidal thought.  She denies any auditory or visual hallucination.  There were no paranoia or any delusions.  Her psychomotor activity is slow.  Her fund of knowledge is average.  Her cognition is intact .  She's alert and oriented 3.  Her insight judgment and impulse control is okay.     Established Problem, Stable/Improving (1), Review and summation of old records (2), Review of Last Therapy Session (1), Review of Medication Regimen & Side Effects (2) and Review of New Medication or Change in Dosage (2)  Assessment: Axis I: .  Major Depressive disorder, recurrent.  Mood disorder due to general medical condition  Axis I deferred  Axis III:  Past Medical History  Diagnosis Date  . Depression   . Anxiety   . Obesity   .  Type II diabetes mellitus   . Anemia   . Arthritis   . Headache      Plan:  Patient is started new medication Effexor which is given by a neurologist.  At this time she is no longer taking muscle relaxant, nortriptyline .  I encouraged to keep appointment with therapist for coping and social skills.  She want to give enough time for Effexor and also like to keep appointment with a neurologist who is managing her headaches and giving medication for headaches and depression.  She wants to stay with neurologist for medication management.  I agree with the plan.  She need to try Effexor and she will monitor by neurologist for headaches and anti-depressed and.  However I recommended to call us back in a scheduled appointment if she ever decided to see psychiatrist.  Discuss safety plan that anytime having active suicidal thoughts or homicidal thoughts and she need to call 911 or go to the local emergency room.  Adolphus Hanf T., MD 04/17/2015

## 2015-04-17 NOTE — Progress Notes (Addendum)
NEUROLOGY FOLLOW UP OFFICE NOTE  Susan [name] [id number]  HISTORY OF PRESENT ILLNESS: Susan [name] is a 44 year old right-handed woman with type II diabetes, anxiety, depression and migraine who follows up for migraine and neck pain.  UPDATE: Headaches are unchanged.  She still works third shift. Current abortive therapy:  sumatriptan 100mg , tizanidine 2mg  (both ineffective) Current preventative therapy:  nortriptyline 75mg  (ineffective and stopped taking)  HISTORY: Onset:  Has had migraines on and off for many years.  This episode recurred in June 2015, related to work-stressors. Location:  Bi-frontal and into the neck Quality:  Throbbing/aching Initial Intensity:  7/10 (10/10 when severe) Aura:  no Prodrome:  no Associated symptoms:  Nausea, photophobia, phonophobia, osmophobia, blurred vision. Initial Duration:  Constant but when severe, it lasts 3 days Initial Frequency:  Constant, but severe attacks occur 3 days per week. Triggers/exacerbating factors:  Smells of spicy foods, [place] food, [place] food, stress Relieving factors:  none Activity:  Feels that she needs to remain in bed for 4-5 days per week but forces self to get up to care for her son.  Has missed work all of February.  Past abortive therapy:  Excedrin migraine, ibuprofen, Aleve, Tylenol, tramadol, promethazine, Imitrex (tried it many years ago), Robaxin for neck pain Past preventative therapy:  None.  Previously was on Cymbalta for depression, which caused itching, chiropractor for neck pain (ineffective)  Caffeine:  3 days a week at most Alcohol:  no Smoker:  no Diet:  Good.  Stays hydrated. Exercise:  Walks daily Depression/stress:  Significant depression and anxiety.  Significant stress related to work.  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  She was on the night shift for many years and was granted the day shift, which made her  feel better.  Then they put her on the night shift again and that is when the headaches and neck pain started.  She is currently in a lawsuit with her employer. Sleep hygiene:  Poor. Family history of headache:  no  PAST MEDICAL HISTORY: Past Medical History  Diagnosis Date  . Depression   . Anxiety   . Obesity   . Type II diabetes mellitus   . Anemia   . Arthritis   . Headache     MEDICATIONS: Current Outpatient Prescriptions on File Prior to Visit  Medication Sig Dispense Refill  . glucose blood (BAYER CONTOUR NEXT TEST) test strip Once daily or as instructed 100 each 3  . metFORMIN (GLUCOPHAGE) 500 MG tablet Take 1.5 tablets (750 mg total) by mouth 2 (two) times daily with a meal. 45 tablet 11  . nortriptyline (PAMELOR) 75 MG capsule Take 1 capsule (75 mg total) by mouth at bedtime. 30 capsule 0  . methocarbamol (ROBAXIN) 500 MG tablet Take 1 tablet (500 mg total) by mouth at bedtime as needed for muscle spasms. (Patient not taking: Reported on 02/18/2015) 30 tablet 0  . SUMAtriptan (IMITREX) 100 MG tablet Take 1tab at earliest onset of headache.  May repeat once in 2 hours if headache persists or recurs. (Patient not taking: Reported on 04/17/2015) 10 tablet 2  . tiZANidine (ZANAFLEX) 2 MG tablet Take 1 tablet (2 mg total) by mouth every 8 (eight) hours as needed for muscle spasms. (Patient not taking: Reported on 04/17/2015) 30 tablet 0   Current Facility-Administered Medications on File Prior to Visit  Medication Dose Route Frequency Provider Last Rate Last Dose  .  ibuprofen (ADVIL,MOTRIN) tablet 600 mg  600 mg Oral Once Pearline Cables, MD        ALLERGIES: Allergies  Allergen Reactions  . Cymbalta [Duloxetine Hcl] Rash  . Divalproex Sodium     REACTION: itching, hives  . Duloxetine     REACTION: itching, hives    FAMILY HISTORY: Family History  Problem Relation Age of Onset  . Asthma Mother   . Hypertension Mother   . Diabetes Mother   . Heart disease Father      CABG  . Asthma Father   . Breast cancer Maternal Grandmother   . Dementia Maternal Grandmother   . Colon cancer Neg Hx   . Anxiety disorder Brother   . Depression Brother     SOCIAL HISTORY: History   Social History  . Marital Status: Single    Spouse Name: N/A  . Number of Children: N/A  . Years of Education: N/A   Occupational History  . Not on file.   Social History Main Topics  . Smoking status: Former Games developer  . Smokeless tobacco: Never Used  . Alcohol Use: No  . Drug Use: No  . Sexual Activity: No   Other Topics Concern  . Not on file   Social History Narrative   Works at post office   41 year old son    REVIEW OF SYSTEMS: Constitutional: No fevers, chills, or sweats, no generalized fatigue, change in appetite Eyes: No visual changes, double vision, eye pain Ear, nose and throat: No hearing loss, ear pain, nasal congestion, sore throat Cardiovascular: No chest pain, palpitations Respiratory:  No shortness of breath at rest or with exertion, wheezes GastrointestinaI: No nausea, vomiting, diarrhea, abdominal pain, fecal incontinence Genitourinary:  No dysuria, urinary retention or frequency Musculoskeletal:  Neck pain Integumentary: No rash, pruritus, skin lesions Neurological: as above Psychiatric: No depression, insomnia, anxiety Endocrine: No palpitations, fatigue, diaphoresis, mood swings, change in appetite, change in weight, increased thirst Hematologic/Lymphatic:  No anemia, purpura, petechiae. Allergic/Immunologic: no itchy/runny eyes, nasal congestion, recent allergic reactions, rashes  PHYSICAL EXAM: Filed Vitals:   04/17/15 0804  BP: 118/66  Pulse: 70  Resp: 16   General: No acute distress Head:  Normocephalic/atraumatic Eyes:  Fundoscopic exam unremarkable without vessel changes, exudates, hemorrhages or papilledema. Neck: supple, paraspinal tenderness, decreased range of motion in all directions Heart:  Regular rate and rhythm Lungs:   Clear to auscultation bilaterally Back: No paraspinal tenderness Neurological Exam: alert and oriented to person, place, and time. Attention span and concentration intact, recent and remote memory intact, fund of knowledge intact.  Speech fluent and not dysarthric, language intact.  Reduced sensation in left side of face.  Otherwise, CN II-XII intact. Fundoscopic exam unremarkable without vessel changes, exudates, hemorrhages or papilledema.  Bulk and tone normal, muscle strength 5/5 throughout.  Sensation to light touch, temperature and vibration intact.  Deep tendon reflexes 2+ throughout, toes downgoing.  Finger to nose and heel to shin testing intact.  Gait normal, Romberg negative.  IMPRESSION: Chronic migraine without aura, intractable Depression Neck pain  PLAN: 1.  Will start venlafaxine ER 37.5mg  daily for 7 days, then increase to  daily 2.  Refer to Dr. Katrinka Blazing for neck pain 3.  Will write letter to recommend not working third shift. 4.  Follow up in 3 months. 5.  Will provide information regarding Botox.  15 minutes spent face to face with patient, over 50% spent discussing management.  Shon Millet, DO  CC: Thomos Lemons, MD

## 2015-04-21 IMAGING — MG MM DIGITAL SCREENING BILAT W/ CAD
4 series · 4 of 4 positions shown · non-contrast
Comparison: Previous exam(s).

CLINICAL DATA: Screening.

EXAM:
DIGITAL SCREENING BILATERAL MAMMOGRAM WITH CAD

[R CC]
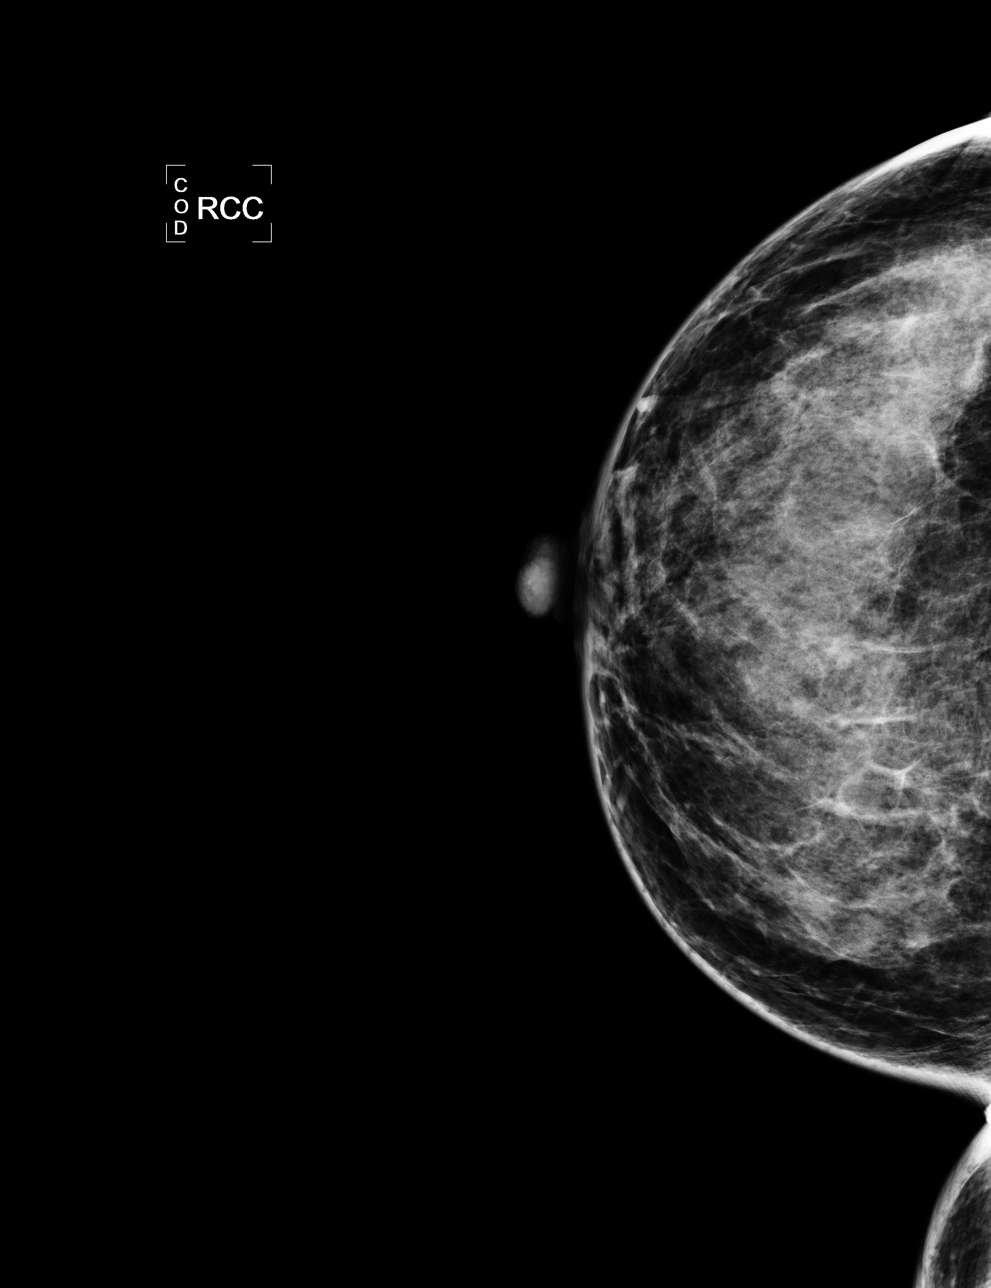

[L CC]
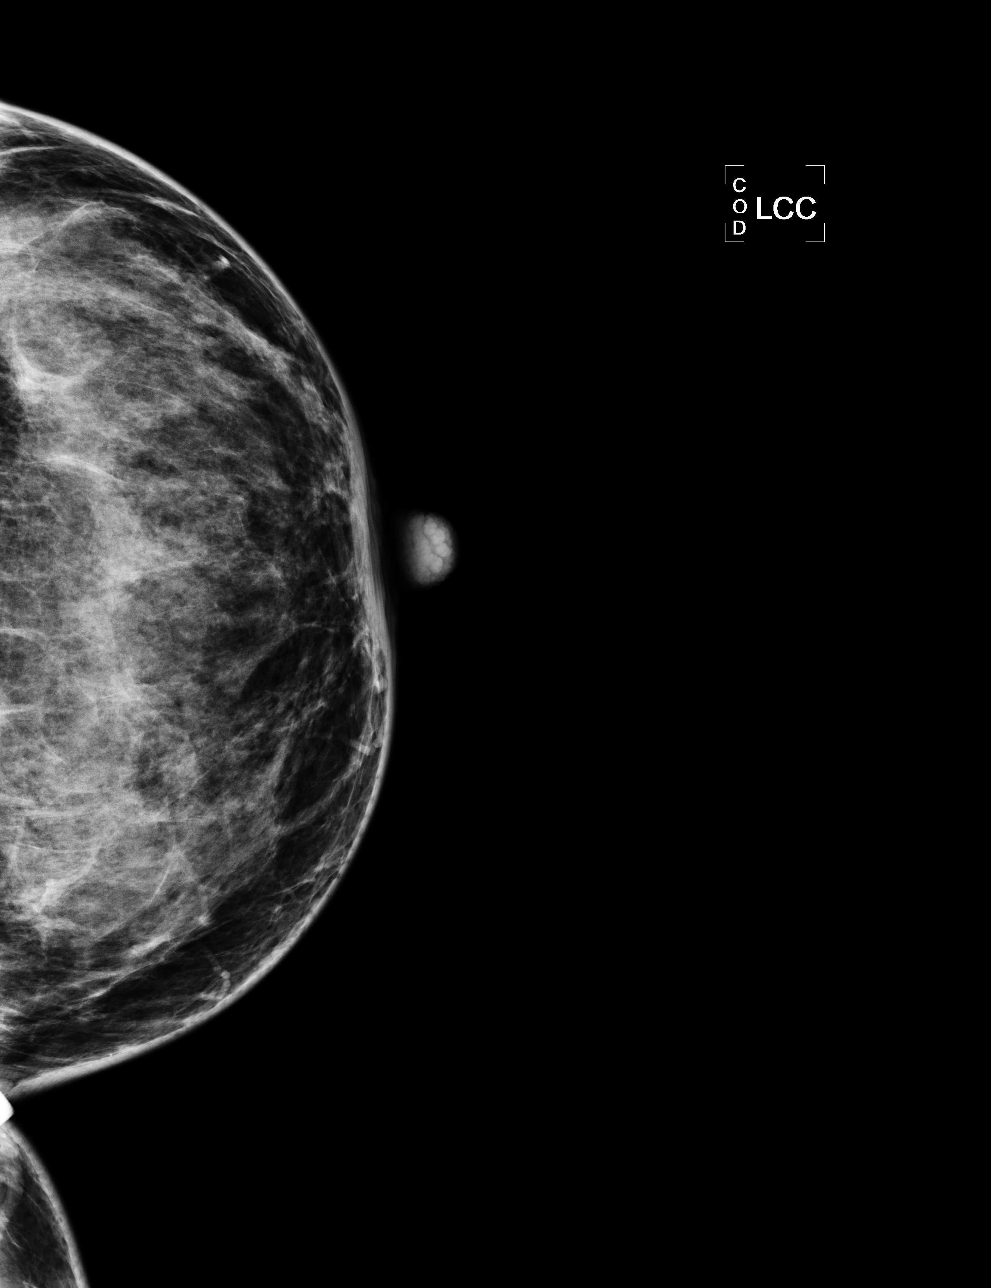

[L MLO]
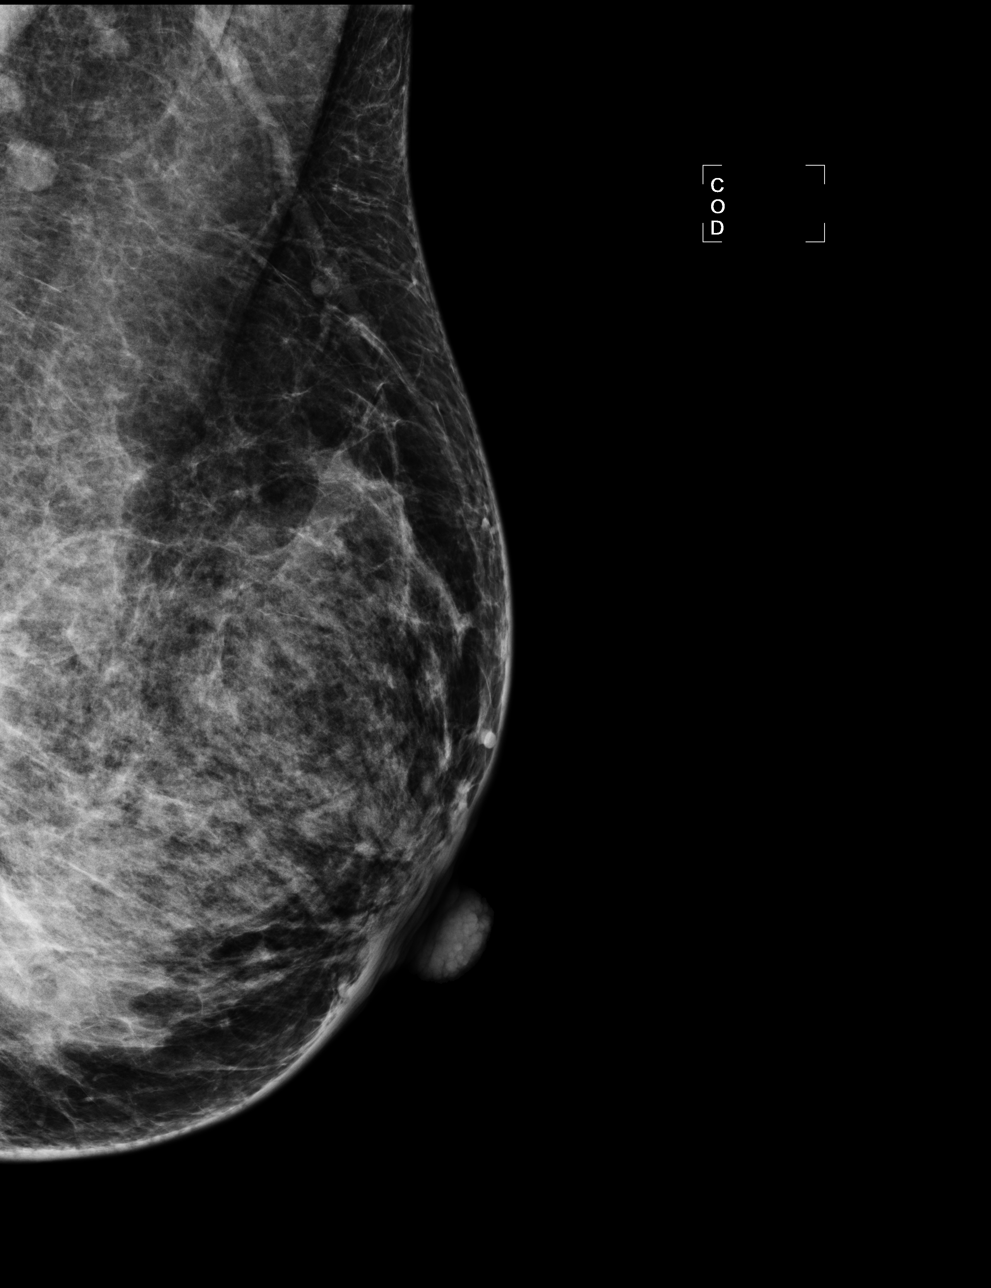

[R MLO]
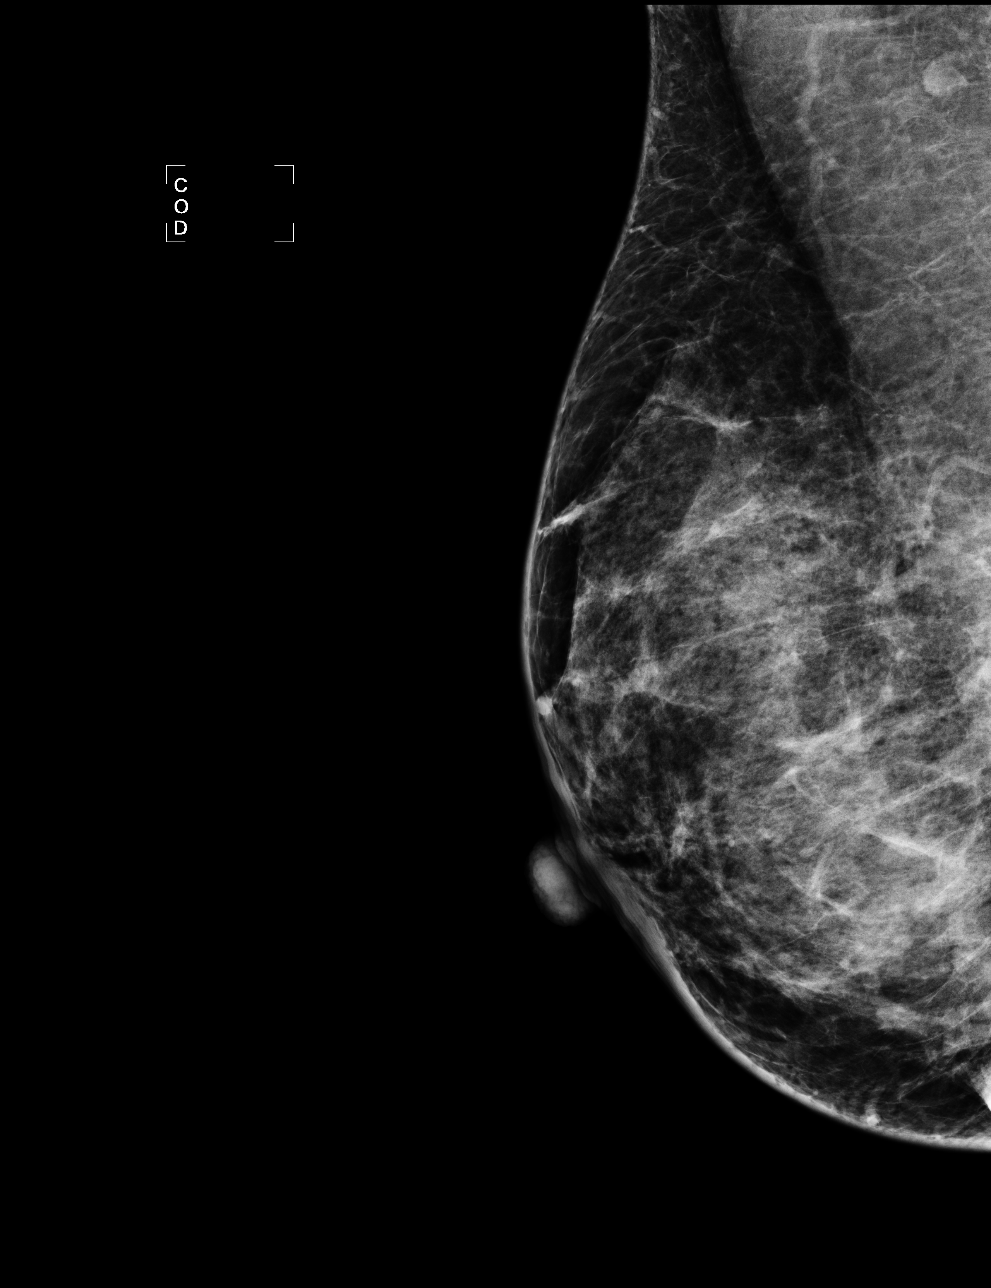

[4 of 4 positions shown; findings below may reference images not displayed]

ACR Breast Density Category c: The breast tissue is heterogeneously
dense, which may obscure small masses.
FINDINGS: There are no findings suspicious for malignancy. Images were
processed with CAD.
IMPRESSION: No mammographic evidence of malignancy. A result letter of this
screening mammogram will be mailed directly to the patient.

RECOMMENDATION:
Screening mammogram in one year. (Code:YJ-[id number])

BI-RADS CATEGORY  1: Negative.

## 2015-04-25 ENCOUNTER — Encounter: Payer: Self-pay | Admitting: Family Medicine

## 2015-04-25 ENCOUNTER — Ambulatory Visit (INDEPENDENT_AMBULATORY_CARE_PROVIDER_SITE_OTHER): Payer: 59 | Admitting: Family Medicine

## 2015-04-25 VITALS — BP 114/70 | HR 76 | Ht 68.0 in | Wt 208.0 lb

## 2015-04-25 DIAGNOSIS — G43819 Other migraine, intractable, without status migrainosus: Secondary | ICD-10-CM

## 2015-04-25 DIAGNOSIS — M9902 Segmental and somatic dysfunction of thoracic region: Secondary | ICD-10-CM | POA: Diagnosis not present

## 2015-04-25 DIAGNOSIS — M9908 Segmental and somatic dysfunction of rib cage: Secondary | ICD-10-CM | POA: Diagnosis not present

## 2015-04-25 DIAGNOSIS — M9901 Segmental and somatic dysfunction of cervical region: Secondary | ICD-10-CM

## 2015-04-25 DIAGNOSIS — M999 Biomechanical lesion, unspecified: Secondary | ICD-10-CM

## 2015-04-25 NOTE — Assessment & Plan Note (Signed)
Decision today to treat with OMT was based on Physical Exam  After verbal consent patient was treated with HVLA, ME techniques in cervical, thoracic, rib areas  Patient tolerated the procedure well with improvement in symptoms  Patient given exercises, stretches and lifestyle modifications  See medications in patient instructions if given  Patient will follow up in 3-4 weeks      

## 2015-04-25 NOTE — Progress Notes (Signed)
[facility] [address] [place], [place] [address] Phone: [phone] Subjective:    I'm seeing this patient by the request  of:  Jaffe Md.   CC: Migraines and neck pain  BJY:Susan [name] is a 44 y.o. female coming in with complaint of migraines and neck pain. Patient has had migraines for quite some time. Patient has tried multiple different medications at this point is doing more important therapy with Imitrex as well as muscle relaxers. Patient has been recent we put on Effexor. Patient has not notice any significant improvement but is going down the medication for for 5 days. Patient states stress seems to be the biggest trigger. Patient is in the middle of a lawsuit with the [employer] over discrimination. Patient states that this is causing significant anxiety in her life. Patient notices whenever she does that she has more neck pain in the migraines seems to be worse. States that it seems to be on the temporal lobes bilaterally and does have photophobia as well as worsening with lower noises. Patient denies any nausea or vomiting with it. States that the aborted of medications can be helpful. Takes over-the-counter medications do not make any significant improvement. Patient states that she is not sleeping well secondary to headaches but states that there do not seem worse in the morning. Denies any significant radiation to her extremities and a regular basis. States though that her neck feels tight overall. Rates the severity of 8 out of 10.  Past Medical History  Diagnosis Date  . Depression   . Anxiety   . Obesity   . Type II diabetes mellitus   . Anemia   . Arthritis   . Headache    Past Surgical History  Procedure Laterality Date  . Tubal ligation    . Wisdom tooth cyst removed     History  Substance Use Topics  . Smoking status: Former Games developer  . Smokeless tobacco: Never Used  . Alcohol Use: No   Family History  Problem  Relation Age of Onset  . Asthma Mother   . Hypertension Mother   . Diabetes Mother   . Heart disease Father     CABG  . Asthma Father   . Breast cancer Maternal Grandmother   . Dementia Maternal Grandmother   . Colon cancer Neg Hx   . Anxiety disorder Brother   . Depression Brother    Allergies  Allergen Reactions  . Cymbalta [Duloxetine Hcl] Rash  . Divalproex Sodium     REACTION: itching, hives  . Duloxetine     REACTION: itching, hives       Past medical history, social, surgical and family history all reviewed in electronic medical record.   Review of Systems: No headache, visual changes, nausea, vomiting, diarrhea, constipation, dizziness, abdominal pain, skin rash, fevers, chills, night sweats, weight loss, swollen lymph nodes, body aches, joint swelling, muscle aches, chest pain, shortness of breath, mood changes.   Objective Blood pressure 114/70, pulse 76, height  (1.727 m), weight 208 lb (94.348 kg), last menstrual period 04/10/2015, SpO2 98 %.  General: No apparent distress alert and oriented x3 mood and affect normal, dressed appropriately.  HEENT: Pupils equal, extraocular movements intact  Respiratory: Patient's speak in full sentences and does not appear short of breath  Cardiovascular: No lower extremity edema, non tender, no erythema  Skin: Warm dry intact with no signs of infection or rash on extremities or on axial  skeleton.  Abdomen: Soft nontender  Neuro: Cranial nerves II through XII are intact, neurovascularly intact in all extremities with 2+ DTRs and 2+ pulses.  Lymph: No lymphadenopathy of posterior or anterior cervical chain or axillae bilaterally.  Gait normal with good balance and coordination.  MSK:  Non tender with full range of motion and good stability and symmetric strength and tone of shoulders, elbows, wrist, hip, knee and ankles bilaterally.  Neck: Inspection unremarkable. No palpable stepoffs. Negative Spurling's  maneuver. Significant tightness actively as well as passively lacking the last 10 of rotation, side bending as well as flexion and extension Grip strength and sensation normal in bilateral hands Strength good C4 to T1 distribution No sensory change to C4 to T1 Negative Hoffman sign bilaterally Reflexes normal Patient's tenderness is out of proportion. Patient is very tender to even light sensation of the neck in the upper back.  Osteopathic findings Cervical C2 flexed rotated and side bent right C4 flexed rotated and side bent left Thoracic T1 extended rotated and side bent left with elevated first rib T3 extended rotated and side bent right  Procedure note 97110; 15 minutes spent for Therapeutic exercises as stated in above notes.  This included exercises focusing on stretching, strengthening, with significant focus on eccentric aspects.  Shoulder Exercises that included:  Basic scapular stabilization to include adduction and depression of scapula Scaption, focusing on proper movement and good control Internal and External rotation utilizing a theraband, with elbow tucked at side entire time Rows with theraband Proper technique shown and discussed handout in great detail with ATC.  All questions were discussed and answered.     Impression and Recommendations:     This case required medical decision making of moderate complexity.

## 2015-04-25 NOTE — Patient Instructions (Addendum)
Good to see you.  Ice 20 minutes 2 times daily. Usually after activity and before bed. Exercises 3 times a week.  Stand on wall with heels, butt shoulder and head touching for goal of 5 minutes a day.  2 tennis balls in tube sock and lay on them at where head meets neck and wait for a release.  Fish oil 2 grams daily Vitamin D 2000 IU daily Turmeric  2 times daily See me again in 3 weeks.  Standing:  Secure a rubber exercise band/tubing so that it is at the height of your shoulders when you are either standing or sitting on a firm arm-less chair.  Grasp an end of the band/tubing in each hand and have your palms face each other. Straighten your elbows and lift your hands straight in front of you at shoulder height. Step back away from the secured end of band/tubing until it becomes tense.  Squeeze your shoulder blades together. Keeping your elbows locked and your hands at shoulder-height, bring your hands out to your side.  Hold __________ seconds. Slowly ease the tension on the band/tubing as you reverse the directions and return to the starting position. Repeat __________ times. Complete this exercise __________ times per day. STRENGTH - Scapular Retractors  Secure a rubber exercise band/tubing so that it is at the height of your shoulders when you are either standing or sitting on a firm arm-less chair.  With a palm-down grip, grasp an end of the band/tubing in each hand. Straighten your elbows and lift your hands straight in front of you at shoulder height. Step back away from the secured end of band/tubing until it becomes tense.  Squeezing your shoulder blades together, draw your elbows back as you bend them. Keep your upper arm lifted away from your body throughout the exercise.  Hold __________ seconds. Slowly ease the tension on the band/tubing as you reverse the directions and return to the starting position. Repeat __________ times. Complete this exercise __________ times  per day. STRENGTH - Shoulder Extensors   Secure a rubber exercise band/tubing so that it is at the height of your shoulders when you are either standing or sitting on a firm arm-less chair.  With a thumbs-up grip, grasp an end of the band/tubing in each hand. Straighten your elbows and lift your hands straight in front of you at shoulder height. Step back away from the secured end of band/tubing until it becomes tense.  Squeezing your shoulder blades together, pull your hands down to the sides of your thighs. Do not allow your hands to go behind you.  Hold for __________ seconds. Slowly ease the tension on the band/tubing as you reverse the directions and return to the starting position. Repeat __________ times. Complete this exercise __________ times per day.  STRENGTH - Scapular Retractors and External Rotators  Secure a rubber exercise band/tubing so that it is at the height of your shoulders when you are either standing or sitting on a firm arm-less chair.  With a palm-down grip, grasp an end of the band/tubing in each hand. Bend your elbows 90 degrees and lift your elbows to shoulder height at your sides. Step back away from the secured end of band/tubing until it becomes tense.  Squeezing your shoulder blades together, rotate your shoulder so that your upper arm and elbow remain stationary, but your fists travel upward to head-height.  Hold __________ for seconds. Slowly ease the tension on the band/tubing as you reverse the directions and return  to the starting position. Repeat __________ times. Complete this exercise __________ times per day.  STRENGTH - Scapular Retractors and External Rotators, Rowing  Secure a rubber exercise band/tubing so that it is at the height of your shoulders when you are either standing or sitting on a firm arm-less chair.  With a palm-down grip, grasp an end of the band/tubing in each hand. Straighten your elbows and lift your hands straight in front of  you at shoulder height. Step back away from the secured end of band/tubing until it becomes tense.  Step 1: Squeeze your shoulder blades together. Bending your elbows, draw your hands to your chest as if you are rowing a boat. At the end of this motion, your hands and elbow should be at shoulder-height and your elbows should be out to your sides.  Step 2: Rotate your shoulder to raise your hands above your head. Your forearms should be vertical and your upper-arms should be horizontal.  Hold for __________ seconds. Slowly ease the tension on the band/tubing as you reverse the directions and return to the starting position. Repeat __________ times. Complete this exercise __________ times per day.  STRENGTH - Scapular Retractors and Elevators  Secure a rubber exercise band/tubing so that it is at the height of your shoulders when you are either standing or sitting on a firm arm-less chair.  With a thumbs-up grip, grasp an end of the band/tubing in each hand. Step back away from the secured end of band/tubing until it becomes tense.  Squeezing your shoulder blades together, straighten your elbows and lift your hands straight over your head.  Hold for __________ seconds. Slowly ease the tension on the band/tubing as you reverse the directions and return to the starting position. Repeat __________ times. Complete this exercise __________ times per day.  Document Released: 11/01/2005 Document Revised: 01/24/2012 Document Reviewed: 02/13/2009 [facility] Patient Information 2015 [place], [place]. This information is not intended to replace advice given to you by your health care provider. Make sure you discuss any questions you have with your health care provider.

## 2015-04-25 NOTE — Progress Notes (Signed)
Pre visit review using our clinic review tool, if applicable. No additional management support is needed unless otherwise documented below in the visit note. 

## 2015-04-25 NOTE — Assessment & Plan Note (Signed)
Patient does have significant tightness of the neck mostly on the right side. I do think that this could be contributing to her migraines. Patient does have a lot of stress as well at this time. We discussed icing regimen, home exercises, manual massage techniques and postural changes that could all be beneficial. We discussed over-the-counter natural supplementations a could also be helpful. Encourage patient to continue on the Effexor. Patient will continue to follow up with neurology. Patient gets some benefit from the manipulation she will come back and see me again in 3 weeks.

## 2015-04-29 ENCOUNTER — Telehealth: Payer: Self-pay | Admitting: Internal Medicine

## 2015-04-29 NOTE — Telephone Encounter (Signed)
Pt came in to have labs ordered. Upon review she has not been seen for routine care since August of 2015. Will need to sxchedule an appointment to see a MD and then we can determine appropriate labs to be drawn.

## 2015-04-29 NOTE — Telephone Encounter (Signed)
Patient came in today stating that she needs labs to be done. A1C and cholestrol she stated that something happened to the blood work done back in September.

## 2015-04-30 ENCOUNTER — Other Ambulatory Visit: Payer: Self-pay | Admitting: Internal Medicine

## 2015-04-30 DIAGNOSIS — E119 Type 2 diabetes mellitus without complications: Secondary | ICD-10-CM

## 2015-04-30 DIAGNOSIS — D649 Anemia, unspecified: Secondary | ICD-10-CM

## 2015-04-30 NOTE — Telephone Encounter (Signed)
I suggest BMET, A1c, microalb / cr ratio, FLP, LFTs - 250.00,  CBCD - use anemia code, TSH - 272.4

## 2015-04-30 NOTE — Telephone Encounter (Signed)
Labs are ordered 

## 2015-04-30 NOTE — Telephone Encounter (Signed)
Appointment made for Tuesday, 21st at 11:30 with MD K. Called patient to make aware and to confirm can come in tomorrow for labs. Left voicemail to call back.

## 2015-05-06 ENCOUNTER — Ambulatory Visit: Payer: 59 | Admitting: Internal Medicine

## 2015-05-06 DIAGNOSIS — Z0289 Encounter for other administrative examinations: Secondary | ICD-10-CM

## 2015-05-06 NOTE — Telephone Encounter (Signed)
Another attempt made by Lupita Leash to call patient. Patient did not show up for appointment. Awaiting call back from patient.

## 2015-05-07 ENCOUNTER — Ambulatory Visit: Payer: Self-pay | Admitting: Family Medicine

## 2015-05-20 ENCOUNTER — Ambulatory Visit (INDEPENDENT_AMBULATORY_CARE_PROVIDER_SITE_OTHER): Payer: PRIVATE HEALTH INSURANCE | Admitting: Clinical

## 2015-05-20 ENCOUNTER — Encounter (HOSPITAL_COMMUNITY): Payer: Self-pay | Admitting: Clinical

## 2015-05-20 DIAGNOSIS — F4310 Post-traumatic stress disorder, unspecified: Secondary | ICD-10-CM | POA: Diagnosis not present

## 2015-05-20 DIAGNOSIS — F331 Major depressive disorder, recurrent, moderate: Secondary | ICD-10-CM
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
## 2015-05-21 ENCOUNTER — Ambulatory Visit (INDEPENDENT_AMBULATORY_CARE_PROVIDER_SITE_OTHER): Payer: 59 | Admitting: Family Medicine

## 2015-05-21 ENCOUNTER — Other Ambulatory Visit: Payer: Self-pay | Admitting: Internal Medicine

## 2015-05-21 ENCOUNTER — Other Ambulatory Visit: Payer: Self-pay | Admitting: *Deleted

## 2015-05-21 ENCOUNTER — Encounter: Payer: Self-pay | Admitting: Family Medicine

## 2015-05-21 VITALS — BP 110/62 | HR 76 | Ht 68.0 in | Wt 214.0 lb

## 2015-05-21 DIAGNOSIS — M999 Biomechanical lesion, unspecified: Secondary | ICD-10-CM

## 2015-05-21 DIAGNOSIS — M9908 Segmental and somatic dysfunction of rib cage: Secondary | ICD-10-CM | POA: Diagnosis not present

## 2015-05-21 DIAGNOSIS — G43819 Other migraine, intractable, without status migrainosus: Secondary | ICD-10-CM

## 2015-05-21 DIAGNOSIS — M9901 Segmental and somatic dysfunction of cervical region: Secondary | ICD-10-CM | POA: Diagnosis not present

## 2015-05-21 DIAGNOSIS — M9902 Segmental and somatic dysfunction of thoracic region: Secondary | ICD-10-CM | POA: Diagnosis not present

## 2015-05-21 MED ORDER — VENLAFAXINE HCL ER 75 MG PO CP24: ORAL_CAPSULE | ORAL | Status: DC

## 2015-05-21 MED ORDER — GABAPENTIN 100 MG PO CAPS: 100.0000 mg | ORAL_CAPSULE | Freq: Every day | ORAL | Status: DC

## 2015-05-21 NOTE — Progress Notes (Signed)
Pre visit review using our clinic review tool, if applicable. No additional management support is needed unless otherwise documented below in the visit note. 

## 2015-05-21 NOTE — Patient Instructions (Addendum)
Good to see you Physical therapy will call you Gabapentin  at night Continue the other vitamins and the home exercises Try to get those xrays See me again in 4 weeks.

## 2015-05-21 NOTE — Assessment & Plan Note (Signed)
Patient is doing somewhat better. Patient was started on gabapentin to see if this will be beneficial as well. Patient has had outside x-rays from a chiropractor. We will send patient to physical therapy for further evaluation as well. I think this will be beneficial. Patient has been making some mild progress as well. Patient will continue all the other medications and some of the natural supplementations. Patient will come back and see me again in 4 weeks for further evaluation and treatment.
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
## 2015-05-21 NOTE — Assessment & Plan Note (Signed)
Decision today to treat with OMT was based on Physical Exam  After verbal consent patient was treated with HVLA, ME techniques in cervical, thoracic, rib areas  Patient tolerated the procedure well with improvement in symptoms  Patient given exercises, stretches and lifestyle modifications  See medications in patient instructions if given  Patient will follow up in 4 weeks    

## 2015-05-29 ENCOUNTER — Other Ambulatory Visit (INDEPENDENT_AMBULATORY_CARE_PROVIDER_SITE_OTHER): Payer: 59

## 2015-05-29 ENCOUNTER — Telehealth: Payer: Self-pay | Admitting: Neurology

## 2015-05-29 DIAGNOSIS — E119 Type 2 diabetes mellitus without complications: Secondary | ICD-10-CM

## 2015-05-29 DIAGNOSIS — D649 Anemia, unspecified: Secondary | ICD-10-CM

## 2015-05-29 LAB — CBC WITH DIFFERENTIAL/PLATELET
Basophils Absolute: 0.1 10*3/uL (ref 0.0–0.1)
Basophils Relative: 1.5 % (ref 0.0–3.0)
EOS ABS: 0.1 10*3/uL (ref 0.0–0.7)
Eosinophils Relative: 1 % (ref 0.0–5.0)
HCT: 33.9 % — ABNORMAL LOW (ref 36.0–46.0)
Hemoglobin: 11 g/dL — ABNORMAL LOW (ref 12.0–15.0)
LYMPHS ABS: 1.9 10*3/uL (ref 0.7–4.0)
Lymphocytes Relative: 27.9 % (ref 12.0–46.0)
MCHC: 32.4 g/dL (ref 30.0–36.0)
MCV: 90.4 fl (ref 78.0–100.0)
MONO ABS: 0.4 10*3/uL (ref 0.1–1.0)
Monocytes Relative: 5.8 % (ref 3.0–12.0)
NEUTROS PCT: 63.8 % (ref 43.0–77.0)
Neutro Abs: 4.3 10*3/uL (ref 1.4–7.7)
Platelets: 338 10*3/uL (ref 150.0–400.0)
RBC: 3.75 Mil/uL — ABNORMAL LOW (ref 3.87–5.11)
RDW: 16.5 % — AB (ref 11.5–15.5)
WBC: 6.7 10*3/uL (ref 4.0–10.5)

## 2015-05-29 LAB — LIPID PANEL
Cholesterol: 183 mg/dL (ref 0–200)
HDL: 67.4 mg/dL (ref >39.00)
LDL CALC: 103 mg/dL — AB (ref 0–99)
NonHDL: 115.6
Total CHOL/HDL Ratio: 3
Triglycerides: 61 mg/dL (ref 0.0–149.0)
VLDL: 12.2 mg/dL (ref 0.0–40.0)

## 2015-05-29 LAB — MICROALBUMIN / CREATININE URINE RATIO
Creatinine,U: 160.2 mg/dL
Microalb Creat Ratio: 1.6 mg/g (ref 0.0–30.0)
Microalb, Ur: 2.6 mg/dL — ABNORMAL HIGH (ref 0.0–1.9)

## 2015-05-29 LAB — HEPATIC FUNCTION PANEL
ALBUMIN: 4.4 g/dL (ref 3.5–5.2)
ALK PHOS: 59 U/L (ref 39–117)
ALT: 10 U/L (ref 0–35)
AST: 17 U/L (ref 0–37)
Bilirubin, Direct: 0.1 mg/dL (ref 0.0–0.3)
TOTAL PROTEIN: 8.1 g/dL (ref 6.0–8.3)
Total Bilirubin: 0.5 mg/dL (ref 0.2–1.2)

## 2015-05-29 LAB — TSH: TSH: 1.33 u[IU]/mL (ref 0.35–4.50)

## 2015-05-29 LAB — BASIC METABOLIC PANEL
BUN: 8 mg/dL (ref 6–23)
CALCIUM: 9.9 mg/dL (ref 8.4–10.5)
CO2: 28 meq/L (ref 19–32)
Chloride: 106 mEq/L (ref 96–112)
Creatinine, Ser: 0.77 mg/dL (ref 0.40–1.20)
GFR: 103.81 mL/min (ref >60.00)
GLUCOSE: 106 mg/dL — AB (ref 70–99)
Potassium: 3.9 mEq/L (ref 3.5–5.1)
Sodium: 140 mEq/L (ref 135–145)

## 2015-05-29 LAB — HEMOGLOBIN A1C: Hgb A1c MFr Bld: 5.8 % (ref 4.6–6.5)

## 2015-05-29 NOTE — Patient Instructions (Signed)
, °

## 2015-05-29 NOTE — Telephone Encounter (Signed)
Susan [name],Susan [name] returned call concerning her job form missing medical condition for her employer, Call back @ [phone]

## 2015-05-29 NOTE — Telephone Encounter (Signed)
I spoke with this patient she state that her work says the papers that were filled out by Dr Everlena CooperJaffe were filled wrong per her employee. They will fax another copy with instruction on how to fill it out.

## 2015-06-03 ENCOUNTER — Ambulatory Visit: Payer: 59 | Attending: Family Medicine | Admitting: Physical Therapy

## 2015-06-03 DIAGNOSIS — G8929 Other chronic pain: Secondary | ICD-10-CM | POA: Diagnosis present

## 2015-06-03 DIAGNOSIS — R293 Abnormal posture: Secondary | ICD-10-CM

## 2015-06-03 DIAGNOSIS — M436 Torticollis: Secondary | ICD-10-CM | POA: Diagnosis present

## 2015-06-03 DIAGNOSIS — G44221 Chronic tension-type headache, intractable: Secondary | ICD-10-CM | POA: Insufficient documentation

## 2015-06-03 DIAGNOSIS — M542 Cervicalgia: Secondary | ICD-10-CM | POA: Insufficient documentation

## 2015-06-03 NOTE — Therapy (Signed)
[facility] [address] [place], [place], [address] Phone: [phone]   Fax:  [phone]  Physical Therapy Evaluation  Patient Details  Name: Susan [name] MRN: [id number] Date of Birth: [birth year] Referring Provider:  Judi Saa, DO  Encounter Date: 06/03/2015      PT End of Session - 06/03/15 1341    Visit Number 1   Number of Visits 16   Date for PT Re-Evaluation 07/29/15   PT Start Time 0935   PT Stop Time 1030   PT Time Calculation (min) 55 min   Activity Tolerance Patient limited by pain   Behavior During Therapy [facility] for tasks assessed/performed      Past Medical History  Diagnosis Date  . Depression   . Anxiety   . Obesity   . Type II diabetes mellitus   . Anemia   . Arthritis   . Headache     Past Surgical History  Procedure Laterality Date  . Tubal ligation    . Wisdom tooth cyst removed      There were no vitals filed for this visit.  Visit Diagnosis:  Chronic tension-type headache, intractable - Plan: PT plan of care cert/re-cert  Neck stiffness - Plan: PT plan of care cert/re-cert  Abnormal posture - Plan: PT plan of care cert/re-cert  Chronic neck pain - Plan: PT plan of care cert/re-cert      Subjective Assessment - 06/03/15 0939    Subjective Patient reports gradual onset of neck and headache.  She reports pain is due to stress at work, "I have a lawsuit going on with them".  She has cramps and tingling in both hands down arms.  She has diff turning head to drive, she cannot work, lifting is difficult.  She saw (Jan 2016) chiropractor  with no help (8 visits).  She has been trying to go to the gym and do Dr. exercises, they offer no relief.     Pertinent History chronic LBP, diabetes, depression, anxiety   Limitations Lifting;House hold activities  lying down, sleeping    Diagnostic tests XR at chiro, unable to report findings other than deterioration" of spine   Patient Stated  Goals i want to be able to turn head without pain    Currently in Pain? Yes   Pain Score 8    Pain Location Head   Pain Orientation Right;Left   Pain Descriptors / Indicators Constant;Throbbing;Tightness   Pain Type Chronic pain   Pain Radiating Towards neck and upper back    Pain Onset More than a month ago   Pain Frequency Constant   Aggravating Factors  bright light, noise, turning head   Pain Relieving Factors nothing, tried ice, MD said no heat   Effect of Pain on Daily Activities pain interferes, causes more stress   Multiple Pain Sites No            [facility] PT Assessment - 06/03/15 0951    Assessment   Medical Diagnosis cervicogenic neck pain    Onset Date/Surgical Date 05/03/14   Prior Therapy No, chiro   Precautions   Precautions None   Restrictions   Weight Bearing Restrictions No   Balance Screen   Has the patient fallen in the past 6 months No   Home Environment   Living Environment Private residence   Living Arrangements Children  30 yr old son   Prior Function   Level of Independence Independent   Vocation Full time employment  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   Cognition   Overall Cognitive Status Within Functional Limits for tasks assessed   Observation/Other Assessments   Focus on Therapeutic Outcomes (FOTO)  58%  goal 44%   Sensation   Light Touch Appears Intact   Additional Comments tingling in bilateral UEs, improved    Coordination   Gross Motor Movements are Fluid and Coordinated Not tested   Posture/Postural Control   Postural Limitations Rounded Shoulders;Forward head   AROM   AROM Assessment Site --  pain with all motions   Cervical Flexion 53   Cervical Extension 53   Cervical - Right Side Bend 25   Cervical - Left Side Bend 32   Cervical - Right Rotation 30   Cervical - Left Rotation 17   Strength   Right/Left Shoulder --  WNL   Right/Left Elbow --  WNL   Palpation   Palpation comment Patient responds with a  withdrawl movement with even light palpation along spine and upper cervicals.  Did not feel particularly hypomobile but pain increased L suboccipitals and L lateral neck mm.  Pain as low as L4 and up to upper thoracic . In prone she had no pain with palpation to C4-C5-C6.                    [facility] Adult PT Treatment/Exercise - 06/03/15 0951    Self-Care   Other Self-Care Comments  verbal review of all exercises, advised to stop using weights at gym   Moist Heat Therapy   Number Minutes Moist Heat 10 Minutes   Moist Heat Location Cervical  upper back in prone   Manual Therapy   Manual Therapy Soft tissue mobilization;Passive ROM;Manual Traction   Manual therapy comments for assessment   Passive ROM lateral flexion    Manual Traction did not report pain decrease, in fact reported incrased pressure                PT Education - 06/03/15 1341    Education provided Yes   Education Details PT/POC, HEP, trigger points, heat vs ice, posture   Person(s) Educated Patient   Methods Explanation;Demonstration;Handout   Comprehension Verbalized understanding;Returned demonstration;Need further instruction                    Plan - 06/03/15 1342    Clinical Impression Statement Patient presents with pain in neck, head, decreased tolerance to activity and palpation.  Unable to elicit dizziness today.  Manual traction/release did not feel good to her.  She will benefit from skilled PT to address posture, trigger points and cervical AROM.     Pt will benefit from skilled therapeutic intervention in order to improve on the following deficits Decreased range of motion;Increased fascial restricitons;Dizziness;Impaired UE functional use;Pain;Decreased activity tolerance;Hypomobility;Impaired flexibility;Postural dysfunction;Decreased strength;Decreased mobility   Rehab Potential Good   PT Frequency 2x / week   PT Duration 8 weeks   PT Treatment/Interventions  Cryotherapy;Electrical Stimulation;Ultrasound;Traction;Moist Heat;Functional mobility training;Therapeutic activities;Therapeutic exercise;Neuromuscular re-education;Manual techniques;Taping;Dry needling;Passive range of motion;Patient/family education   PT Next Visit Plan check HEP, dry needling, manual   PT Home Exercise Plan levator, UT and chin tuck, scap retract   Consulted and Agree with Plan of Care Patient         Problem List Patient Active Problem List   Diagnosis Date Noted  . Nonallopathic lesion of cervical region 04/25/2015  . Nonallopathic lesion of thoracic region 04/25/2015  . Nonallopathic lesion-rib cage 04/25/2015  . 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 Preventative health care 07/08/2014  . Food allergy 09/11/2012  . Type II or unspecified type diabetes mellitus without mention of complication, not stated as uncontrolled 07/31/2012  . Glucosuria 07/28/2012  . Depression 08/21/2008  . Anxiety state 02/28/2008    Ryenn Howeth 06/03/2015, 1:59 PM  [facility] [address] [place], [place], [address] Phone: [phone]   Fax:  [phone]  Karie Mainland, PT 06/03/2015 1:59 PM Phone: [phone] Fax: [phone]

## 2015-06-03 NOTE — Patient Instructions (Signed)
  Flexibility: Upper Trapezius Stretch   Gently grasp right side of head while reaching behind back with other hand. Tilt head away until a gentle stretch is felt. Hold __30__ seconds. Repeat ___2-3_ times per set. Do __1__ sets per session. Do ___2_ sessions per day.  http://orth.exer.us/340   Levator Stretch   Grasp seat or sit on hand on side to be stretched. Turn head toward other side and look down. Use hand on head to gently stretch neck in that position. Hold __30__ seconds. Repeat on other side. Repeat _2-3___ times. Do __2__ sessions per day.  http://[email or web address].us/30   Scapular Retraction (Standing)   With arms at sides, pinch shoulder blades together. Repeat _10___ times per set. Do __2__ sets per session. Do __2__ sessions per day.  http://orth.exer.us/944   Flexibility: Neck Retraction   Pull head straight back, keeping eyes and jaw level. Repeat ___10_ times per set. Do __1__ sets per session. Do __2__ sessions per day.  http://orth.exer.us/344   Posture - Sitting   Sit upright, head facing forward. Try using a roll to support lower back. Keep shoulders relaxed, and avoid rounded back. Keep hips level with knees. Avoid crossing legs for long periods.

## 2015-06-04 ENCOUNTER — Telehealth: Payer: Self-pay | Admitting: Neurology

## 2015-06-04 NOTE — Telephone Encounter (Signed)
Patient is aware that I have not received any papers from Dr Everlena CooperJaffe as of yet . Dr Everlena CooperJaffe is aware that [facility]  Friday  06/06/15

## 2015-06-04 NOTE — Telephone Encounter (Signed)
Pt called and wanted to know if her [facility]/Dawn CB# [phone]

## 2015-06-05 ENCOUNTER — Ambulatory Visit: Payer: 59 | Admitting: Physical Therapy

## 2015-06-09 ENCOUNTER — Encounter: Payer: Self-pay | Admitting: Internal Medicine

## 2015-06-09 ENCOUNTER — Ambulatory Visit: Payer: 59 | Admitting: Rehabilitative and Restorative Service Providers"

## 2015-06-09 ENCOUNTER — Ambulatory Visit: Payer: 59 | Admitting: Internal Medicine

## 2015-06-09 VITALS — BP 104/66 | HR 81 | Temp 98.3°F | Resp 16 | Ht 68.0 in | Wt 212.1 lb

## 2015-06-09 DIAGNOSIS — F329 Major depressive disorder, single episode, unspecified: Secondary | ICD-10-CM

## 2015-06-09 DIAGNOSIS — E119 Type 2 diabetes mellitus without complications: Secondary | ICD-10-CM | POA: Diagnosis not present

## 2015-06-09 DIAGNOSIS — F32A Depression, unspecified: Secondary | ICD-10-CM

## 2015-06-09 MED ORDER — METFORMIN HCL 500 MG PO TABS: ORAL_TABLET | ORAL | Status: AC

## 2015-06-09 MED ORDER — ASPIRIN EC 81 MG PO TBEC: 81.0000 mg | DELAYED_RELEASE_TABLET | Freq: Every day | ORAL | Status: AC

## 2015-06-09 MED ORDER — PRAVASTATIN SODIUM 20 MG PO TABS: 20.0000 mg | ORAL_TABLET | Freq: Every day | ORAL | Status: DC

## 2015-06-09 NOTE — Patient Instructions (Signed)
Please complete the following lab tests before your next follow up appointment: BMET, A1c - 250.00 FLP, LFTs - 272.4 Take vitamin D 2000 Units once daily

## 2015-06-09 NOTE — Assessment & Plan Note (Signed)
Exacerbation of symptoms due to stress at work.  Patient to continue working with counselor.  Consider increasing dose of effexor to 150 mg.

## 2015-06-09 NOTE — Assessment & Plan Note (Addendum)
A1c trending lower - 5.8  Continue metformin and lifestyle changes.  Start pravastatin and EC ASA.  Arrange follow up labs.

## 2015-06-09 NOTE — Progress Notes (Signed)
Subjective:    Patient ID: Susan [name], female    DOB: May 05, 1971, 44 y.o.   MRN: [id number]  HPI  44 year old African-American female for follow-up regarding type II diabetes and depression/anxiety.    DM II - blood sugars well controlled.  She is taking metformin regularly and following diabetic diet.  Weight is stable.  A1c improved to 5.8.  Wt Readings from Last 3 Encounters:  06/09/15 212 lb 1.6 oz (96.208 kg)  05/21/15 214 lb (97.07 kg)  04/25/15 208 lb (94.348 kg)   Patient's family history reviewed. Her father has history of coronary artery disease. He was a smoker. Patient unclear whether he was diabetic.  Patient reports increased stress at work. Patient is not happy with her current psychiatrist.  She feels she may benefit from medication change.  She is working with Veterinary surgeon.  She has good relationship with her counselor.   Review of Systems Negative for chest pain,  Negative for paraesthesias.  She complains of occasion bilateral leg cramps.  She takes over the counter magnesium supplement.    Past Medical History  Diagnosis Date  . Depression   . Anxiety   . Obesity   . Type II diabetes mellitus   . Anemia   . Arthritis   . Headache     History   Social History  . Marital Status: Single    Spouse Name: N/A  . Number of Children: N/A  . Years of Education: N/A   Occupational History  . Not on file.   Social History Main Topics  . Smoking status: Former Games developer  . Smokeless tobacco: Never Used  . Alcohol Use: No  . Drug Use: No  . Sexual Activity: No   Other Topics Concern  . Not on file   Social History Narrative   Works at post office   71 year old son    Past Surgical History  Procedure Laterality Date  . Tubal ligation    . Wisdom tooth cyst removed      Family History  Problem Relation Age of Onset  . Asthma Mother   . Hypertension Mother   . Diabetes Mother   . Heart disease Father     CABG  . Asthma Father   . Breast  cancer Maternal Grandmother   . Dementia Maternal Grandmother   . Colon cancer Neg Hx   . Anxiety disorder Brother   . Depression Brother     Allergies  Allergen Reactions  . Cymbalta [Duloxetine Hcl] Rash  . Divalproex Sodium     REACTION: itching, hives  . Duloxetine     REACTION: itching, hives    Current Outpatient Prescriptions on File Prior to Visit  Medication Sig Dispense Refill  . gabapentin (NEURONTIN) 100 MG capsule Take 1 capsule (100 mg total) by mouth at bedtime. 30 capsule 3  . glucose blood (BAYER CONTOUR NEXT TEST) test strip Once daily or as instructed 100 each 3  . metFORMIN (GLUCOPHAGE) 500 MG tablet TAKE 1.5 TABLETS (750 MG TOTAL) BY MOUTH 2 (TWO) TIMES DAILY WITH A MEAL. 60 tablet 0  . venlafaxine XR (EFFEXOR XR) 75 MG 24 hr capsule 1  Capsule at HS  # 30 with 3 refills 30 capsule 3  . methocarbamol (ROBAXIN) 500 MG tablet Take 1 tablet (500 mg total) by mouth at bedtime as needed for muscle spasms. (Patient not taking: Reported on 05/20/2015) 30 tablet 0  . SUMAtriptan (IMITREX) 100 MG tablet Take 1tab at earliest  onset of headache.  May repeat once in 2 hours if headache persists or recurs. (Patient not taking: Reported on 05/20/2015) 10 tablet 2  . tiZANidine (ZANAFLEX) 2 MG tablet Take 1 tablet (2 mg total) by mouth every 8 (eight) hours as needed for muscle spasms. (Patient not taking: Reported on 06/09/2015) 30 tablet 0  . venlafaxine XR (EFFEXOR-XR) 37.5 MG 24 hr capsule Take 1cap at bedtime for 7 days, then increase to 2 caps at bedtime. (Patient not taking: Reported on 06/09/2015) 60 capsule 0   Current Facility-Administered Medications on File Prior to Visit  Medication Dose Route Frequency Provider Last Rate Last Dose  . ibuprofen (ADVIL,MOTRIN) tablet 600 mg  600 mg Oral Once [name], MD        BP 104/66 mmHg  Pulse 81  Temp(Src) 98.3 F (36.8 C) (Oral)  Resp 16  Ht 5\' 8"  (1.727 m)  Wt 212 lb 1.6 oz (96.208 kg)  BMI 32.26 kg/m2  SpO2 97%   LMP 05/21/2015    Objective:   Physical Exam  Constitutional: She is oriented to person, place, and time. She appears well-developed and well-nourished. No distress.  HENT:  Head: Normocephalic and atraumatic.  Cardiovascular: Normal rate, regular rhythm and normal heart sounds.   No murmur heard. Pulmonary/Chest: Effort normal and breath sounds normal. She has no wheezes.  Musculoskeletal: She exhibits no edema.  Neurological: She is alert and oriented to person, place, and time. No cranial nerve deficit.  Psychiatric: She has a normal mood and affect. Her behavior is normal.          Assessment & Plan:

## 2015-06-12 ENCOUNTER — Telehealth: Payer: Self-pay | Admitting: Neurology

## 2015-06-12 NOTE — Telephone Encounter (Signed)
VM-Pt called and wanted a call back, did not leave a reason/Dawn CB#[phone]

## 2015-06-12 NOTE — Telephone Encounter (Signed)
I spoke with patient is appears that  The FMLA papers are not filled out to her company  Expectations  She will call me back with more details

## 2015-06-12 NOTE — Telephone Encounter (Signed)
I left message for patient to return my call.

## 2015-06-12 NOTE — Telephone Encounter (Signed)
Pt returned call @ 3:[phone]

## 2015-06-17 ENCOUNTER — Ambulatory Visit: Payer: 59 | Attending: Family Medicine | Admitting: Rehabilitative and Restorative Service Providers"

## 2015-06-17 DIAGNOSIS — G44221 Chronic tension-type headache, intractable: Secondary | ICD-10-CM | POA: Insufficient documentation

## 2015-06-17 DIAGNOSIS — G8929 Other chronic pain: Secondary | ICD-10-CM | POA: Insufficient documentation

## 2015-06-17 DIAGNOSIS — R293 Abnormal posture: Secondary | ICD-10-CM | POA: Diagnosis present

## 2015-06-17 DIAGNOSIS — M542 Cervicalgia: Secondary | ICD-10-CM | POA: Diagnosis present

## 2015-06-17 DIAGNOSIS — M436 Torticollis: Secondary | ICD-10-CM | POA: Diagnosis not present

## 2015-06-17 NOTE — Patient Instructions (Signed)
HEP issued: cervical flexion and lateral flexion with and without overpressure (pt preference) 2x/day 2 reps with 30 sec hold; supine chin tuck followed by supine chin tuck with UE supination/pronation 2x/day 15 reps with 5 sec holds.

## 2015-06-17 NOTE — Therapy (Signed)
[facility] [address] [place], [place], [address] Phone: [phone]   Fax:  [phone]  Physical Therapy Treatment  Patient Details  Name: Susan [name] MRN: [id number] Date of Birth: 01-18-1971 Referring Provider:  Artist Pais, Doe-Hyun Elvera Lennox, DO  Encounter Date: 06/17/2015      PT End of Session - 06/17/15 0934    Visit Number 2   Number of Visits 16   Date for PT Re-Evaluation 07/29/15   PT Start Time 0800   PT Stop Time 0855   PT Time Calculation (min) 55 min      Past Medical History  Diagnosis Date  . Depression   . Anxiety   . Obesity   . Type II diabetes mellitus   . Anemia   . Arthritis   . Headache     Past Surgical History  Procedure Laterality Date  . Tubal ligation    . Wisdom tooth cyst removed      There were no vitals filed for this visit.  Visit Diagnosis:  Neck stiffness  Abnormal posture      Subjective Assessment - 06/17/15 0807    Subjective 7/10 pain currently reported worse on L side; blurry vision with onset.   Pertinent History chronic LBP, diabetes, depression, anxiety   Limitations Lifting;House hold activities   Diagnostic tests XR at chiro, unable to report findings other than deterioration" of spine   Patient Stated Goals i want to be able to turn head without pain    Currently in Pain? Yes   Pain Score 7    Pain Location Neck   Pain Orientation Left;Right   Pain Descriptors / Indicators Aching   Pain Type Chronic pain   Pain Radiating Towards down bil Deltoids   Pain Onset More than a month ago   Pain Frequency Constant   Aggravating Factors  bright light, rotation   Pain Relieving Factors nothing   Effect of Pain on Daily Activities pain hinders                         [facility] Adult PT Treatment/Exercise - 06/17/15 0001    Neck Exercises: Seated   Other Seated Exercise Levator/Scalene/Upper Trap stretches 2x30 sec each side   Neck Exercises: Supine   Other Supine Exercise chin tucks x 15 with 3 sec hold; isometric chin tuck with scapular retract x 15, chin tuck isometric with bil scapular depression x 15; chin tuck with UE supination/pronation x 15 each; chin tucks into heat x 15 with 5 sec hold   Moist Heat Therapy   Number Minutes Moist Heat 10 Minutes   Moist Heat Location Cervical   Manual Therapy   Manual Therapy Soft tissue mobilization   Manual therapy comments bil Upper Trap, cervical paraspinal soft tissue work/trigger point, sternocleidomastoid soft tissue work   Soft tissue mobilization PA mobs C4/5 junction grade 3   Passive ROM PROM all planes   Manual Traction suboccipital release, manual cervical traction                  PT Short Term Goals - 06/17/15 1027    PT SHORT TERM GOAL #1   Title Pt will be I with HEP initial for cervical ROM, posture   Time 4   Period Weeks   Status On-going   PT SHORT TERM GOAL #2   Title Pt will report improvement in headache frequency and intensity, 25% better   Time 4  Period Weeks   Status On-going   PT SHORT TERM GOAL #3   Title Pt will be able to reach, use UEs for light activity and no increase in neck pain    Time 4   Period Weeks   Status On-going           PT Long Term Goals - 06/17/15 7829    PT LONG TERM GOAL #1   Title Pt will be able to demo corrected posture and understand RICE, lifting, posture for prevention of further injury.    Status On-going   PT LONG TERM GOAL #2   Title Pt will be able to turn head to 45 deg bilaterally without neck pain to drive   Time 8   Period Weeks   Status On-going   PT LONG TERM GOAL #3   Title Pt will be able to work, carry mod sized bags with no increase in neck pain   Time 8   Period Weeks   Status On-going               Plan - 06/17/15 0935    Clinical Impression Statement Pt presents with guarded posture with c/o neck pain. Pt with tight cervical paraspinals and hypomobility C4/5 junction.    Pt  will benefit from skilled therapeutic intervention in order to improve on the following deficits Decreased range of motion;Increased fascial restricitons;Dizziness;Impaired UE functional use;Pain;Decreased activity tolerance;Hypomobility;Impaired flexibility;Postural dysfunction;Decreased strength;Decreased mobility   Rehab Potential Good   Clinical Impairments Affecting Rehab Potential pain   PT Frequency 2x / week   PT Duration 8 weeks   PT Treatment/Interventions Cryotherapy;Electrical Stimulation;Ultrasound;Traction;Moist Heat;Functional mobility training;Therapeutic activities;Therapeutic exercise;Neuromuscular re-education;Manual techniques;Taping;Dry needling;Passive range of motion;Patient/family education   PT Next Visit Plan check HEP, manual techniques, PA cervical mobs, PROM, [place]   PT Home Exercise Plan see pt instructions   Consulted and Agree with Plan of Care Patient        Problem List Patient Active Problem List   Diagnosis Date Noted  . Nonallopathic lesion of cervical region 04/25/2015  . Nonallopathic lesion of thoracic region 04/25/2015  . Nonallopathic lesion-rib cage 04/25/2015  . 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 Preventative health care 07/08/2014  . Food allergy 09/11/2012  . Diabetes mellitus type II, controlled 07/31/2012  . Glucosuria 07/28/2012  . Depression 08/21/2008  . Anxiety state 02/28/2008    Thornell Sartorius, PT, DPT 06/17/2015, 9:39 AM  [facility] [address] [place], [place], [address] Phone: [phone]   Fax:  [phone]

## 2015-06-18 ENCOUNTER — Ambulatory Visit: Payer: Self-pay | Admitting: Family Medicine

## 2015-06-20 ENCOUNTER — Ambulatory Visit: Payer: 59 | Admitting: Rehabilitative and Restorative Service Providers"

## 2015-06-23 ENCOUNTER — Ambulatory Visit (INDEPENDENT_AMBULATORY_CARE_PROVIDER_SITE_OTHER): Payer: PRIVATE HEALTH INSURANCE | Admitting: Clinical

## 2015-06-23 ENCOUNTER — Encounter (HOSPITAL_COMMUNITY): Payer: Self-pay | Admitting: Clinical

## 2015-06-23 DIAGNOSIS — F4310 Post-traumatic stress disorder, unspecified: Secondary | ICD-10-CM | POA: Diagnosis not present

## 2015-06-23 DIAGNOSIS — F331 Major depressive disorder, recurrent, moderate: Secondary | ICD-10-CM | POA: Diagnosis not present

## 2015-06-23 NOTE — Progress Notes (Signed)
   THERAPIST PROGRESS NOTE  Session Time: 2:27 - 3:27  Participation Level: Active  Behavioral Response: NeatAlertDepressed  Type of Therapy: Individual Therapy  Treatment Goals addressed: Improve psychiatric symptoms, elevate mood and functioning, Improve unhelpful thought patterns, Stress management  Interventions: CBT and Motivational Interviewing, grounding and mindfulness techniques  Summary: Susan [name] is [name] 44 y.o. female who presents with Major depressive disorder, recurrent episode, moderate and PTSD.   Suicidal/Homicidal: Nowithout intent/plan  Therapist Response: Rodena Piety met with clinician for an individual session. [name] discussed her psychiatric symptoms and  her current life events. Rodena Piety and clinician discussed her goals for therapy [name] shared that she has been experiencing [name] lot of depression lately. She shared that she was very upset about not being able to return to work because of being given the wrong information. She shared that her work situation and not having an income is causing her to worry and feel depressed. She shared that it makes her feel discouraged. She stated that she is also concerned about some upcoming doctors appointments as well as some issues with her son. She shared her son is [name] pretty good kid but has made some poor decisions. She shared that it was upsetting watching him make mistakes. Client and clinician discussed stress management and her thought process. She shared that she experiences [name] lot  Of negative thoughts. Clinician introduced grounding and mindfulness techniques. Clinician explained the process, purpose and practice. Client and clinician discussed and then practiced some of the techniques. Clinician briefly introduced some cbt concepts. Susan [name] agreed to complete [name] on depression and practice her grounding and mindfulness techniques until next session.  Plan: Return again in 2-3 weeks.  Diagnosis: Axis I: Major depressive disorder,  recurrent episode, moderate and PTSD     Susan Taketa A, LCSW 06/23/2015

## 2015-06-24 ENCOUNTER — Ambulatory Visit: Payer: 59 | Admitting: Physical Therapy

## 2015-06-24 DIAGNOSIS — M542 Cervicalgia: Secondary | ICD-10-CM

## 2015-06-24 DIAGNOSIS — M436 Torticollis: Secondary | ICD-10-CM

## 2015-06-24 DIAGNOSIS — G8929 Other chronic pain: Secondary | ICD-10-CM

## 2015-06-24 DIAGNOSIS — G44221 Chronic tension-type headache, intractable: Secondary | ICD-10-CM

## 2015-06-24 DIAGNOSIS — R293 Abnormal posture: Secondary | ICD-10-CM

## 2015-06-24 NOTE — Patient Instructions (Signed)
Levator Stretch   Grasp seat or sit on hand on side to be stretched. Turn head toward other side and look down. Use hand on head to gently stretch neck in that position. Hold _20___ seconds. Repeat on other side. Repeat _3___ times. Do ___3_ sessions per day.  http://gt2.exer.us/30   Copyright  VHI. All rights reserved.  Side-Bending   One hand on opposite side of head, pull head to side as far as is comfortable. Stop if there is pain. Hold __20__ seconds. Repeat with other hand to other side. Repeat __3__ times. Do ___3_ sessions per day.   Copyright  VHI. All rights reserved.  Scapular Retraction (Standing)   With arms at sides, pinch shoulder blades together. Repeat ___10_ times per set. Do __1__ sets per session. Do __3__ sessions per day.  http://orth.exer.us/944   Copyright  VHI. All rights reserved.   Copyright  VHI. All rights reserved.    Trigger Point Dry Needling  . What is Trigger Point Dry Needling (DN)? o DN is a physical therapy technique used to treat muscle pain and dysfunction. Specifically, DN helps deactivate muscle trigger points (muscle knots).  o A thin filiform needle is used to penetrate the skin and stimulate the underlying trigger point. The goal is for a local twitch response (LTR) to occur and for the trigger point to relax. No medication of any kind is injected during the procedure.   . What Does Trigger Point Dry Needling Feel Like?  o The procedure feels different for each individual patient. Some patients report that they do not actually feel the needle enter the skin and overall the process is not painful. Very mild bleeding may occur. However, many patients feel a deep cramping in the muscle in which the needle was inserted. This is the local twitch response.   Marland Kitchen How Will I feel after the treatment? o Soreness is normal, and the onset of soreness may not occur for a few hours. Typically this soreness does not last longer than two days.    o Bruising is uncommon, however; ice can be used to decrease any possible bruising.  o In rare cases feeling tired or nauseous after the treatment is normal. In addition, your symptoms may get worse before they get better, this period will typically not last longer than 24 hours.   . What Can I do After My Treatment? o Increase your hydration by drinking more water for the next 24 hours. o You may place ice or heat on the areas treated that have become sore, however, do not use heat on inflamed or bruised areas. Heat often brings more relief post needling. o You can continue your regular activities, but vigorous activity is not recommended initially after the treatment for 24 hours. o DN is best combined with other physical therapy such as strengthening, stretching, and other therapies.

## 2015-06-24 NOTE — Therapy (Signed)
[facility] [address] [place], [place], [address] Phone: [phone]   Fax:  [phone]  Physical Therapy Treatment  Patient Details  Name: Susan [name] MRN: [id number] Date of Birth: [birth year] Referring Provider:  Meda Coffee, DO  Encounter Date: 06/24/2015      PT End of Session - 06/24/15 1524    Visit Number 3   Number of Visits 16   Date for PT Re-Evaluation 07/29/15   PT Start Time 0804   PT Stop Time 0853   PT Time Calculation (min) 49 min   Activity Tolerance Patient limited by pain      Past Medical History  Diagnosis Date  . Depression   . Anxiety   . Obesity   . Type II diabetes mellitus   . Anemia   . Arthritis   . Headache     Past Surgical History  Procedure Laterality Date  . Tubal ligation    . Wisdom tooth cyst removed      There were no vitals filed for this visit.  Visit Diagnosis:  Neck stiffness  Abnormal posture  Chronic tension-type headache, intractable  Chronic neck pain      Subjective Assessment - 06/24/15 0809    Subjective Left neck 8/10 and headaches all over.  I'm no better.     Currently in Pain? Yes   Pain Score 8    Pain Location Neck   Pain Orientation Left;Right   Pain Type Chronic pain   Pain Frequency Constant   Aggravating Factors  noise, bright light, smells of food, rotation    Pain Relieving Factors nothing                         [facility] Adult PT Treatment/Exercise - 06/24/15 0001    Neck Exercises: Seated   Other Seated Exercise Levator/Scalene/Upper Trap stretches 2x30 sec each side   Moist Heat Therapy   Number Minutes Moist Heat 10 Minutes   Moist Heat Location Cervical   Manual Therapy   Manual Therapy Soft tissue mobilization;Muscle Energy Technique   Manual therapy comments bil Upper Trap, cervical paraspinal soft tissue work/trigger point, sternocleidomastoid soft tissue work   Soft tissue mobilization suboccipital  release 1 min   Muscle Energy Technique contract/relax upper traps R/L 6x 5 sec hold           Trigger Point Dry Needling - 06/24/15 1330    Consent Given? Yes  instructed in risks including pneumothorax   Education Handout Provided Yes   Muscles Treated Upper Body --  B cervical multifidi   Upper Trapezius Response Twitch reponse elicited;Palpable increased muscle length   SubOccipitals Response Twitch response elicited;Palpable increased muscle length   Levator Scapulae Response Twitch response elicited;Palpable increased muscle length     Performed bilaterally but L > R         PT Education - 06/24/15 1524    Education provided Yes   Education Details upper trap, levator stretching, scapular retraction; dry needling info   Person(s) Educated Patient   Methods Explanation;Demonstration;Handout   Comprehension Verbalized understanding          PT Short Term Goals - 06/24/15 1529    PT SHORT TERM GOAL #1   Title Pt will be I with HEP initial for cervical ROM, posture   Period Weeks   Status On-going   PT SHORT TERM GOAL #2   Title Pt will report improvement in headache  frequency and intensity, 25% better   Time 4   Period Weeks   Status On-going   PT SHORT TERM GOAL #3   Title Pt will be able to reach, use UEs for light activity and no increase in neck pain    Time 4   Period Weeks   Status On-going           PT Long Term Goals - 06/24/15 1530    PT LONG TERM GOAL #1   Title Pt will be able to demo corrected posture and understand RICE, lifting, posture for prevention of further injury.    Time 8   Period Weeks   Status On-going   PT LONG TERM GOAL #2   Title Pt will be able to turn head to 45 deg bilaterally without neck pain to drive   Time 8   Period Weeks   Status On-going   PT LONG TERM GOAL #3   Title Pt will be able to work, carry mod sized bags with no increase in neck pain   Time 8   Period Weeks   Status On-going   PT LONG TERM GOAL  #4   Title Pt wil be able to report min occasional headaches   Time 8   Period Weeks   Status On-going   PT LONG TERM GOAL #5   Title FOTO score will decreased t0 <45% limited    Time 8   Period Weeks   Status On-going               Plan - 06/24/15 1525    Clinical Impression Statement The patient complains of unchanging severe pain in her neck.  Significant spasm and trigger points noted in left neck and upper trap regions.  She reports no benefit from previous interventions and is receptive to trying dry needling.  Improved soft tissue length and mobility noted following DN with manual interventions.  No progress toward goals yet.     PT Next Visit Plan assess response to first dry needling;  check for improvement with cervical rotation; continue with DN to left neck musculature and possibly add periscap muscles if beneficial        Problem List Patient Active Problem List   Diagnosis Date Noted  . Nonallopathic lesion of cervical region 04/25/2015  . Nonallopathic lesion of thoracic region 04/25/2015  . Nonallopathic lesion-rib cage 04/25/2015  . 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 Preventative health care 07/08/2014  . Food allergy 09/11/2012  . Diabetes mellitus type II, controlled 07/31/2012  . Glucosuria 07/28/2012  . Depression 08/21/2008  . Anxiety state 02/28/2008    Vivien Presto 06/24/2015, 3:32 PM  [facility] [address] [place], [place], [address] Phone: [phone]   Fax:  [phone]   Lavinia Sharps, PT 06/24/2015 3:32 PM Phone: [phone] Fax: [phone]

## 2015-06-26 ENCOUNTER — Telehealth: Payer: Self-pay | Admitting: *Deleted

## 2015-06-26 ENCOUNTER — Encounter: Payer: Self-pay | Admitting: Physical Therapy

## 2015-06-26 NOTE — Telephone Encounter (Signed)
Patient is aware that form has been filled out and fax to her employer as well as copy for her is at front desk

## 2015-06-27 ENCOUNTER — Ambulatory Visit: Payer: 59 | Admitting: Rehabilitative and Restorative Service Providers"

## 2015-07-01 ENCOUNTER — Ambulatory Visit: Payer: 59 | Admitting: Physical Therapy

## 2015-07-01 DIAGNOSIS — G8929 Other chronic pain: Secondary | ICD-10-CM

## 2015-07-01 DIAGNOSIS — M542 Cervicalgia: Secondary | ICD-10-CM

## 2015-07-01 DIAGNOSIS — G44221 Chronic tension-type headache, intractable: Secondary | ICD-10-CM

## 2015-07-01 DIAGNOSIS — M436 Torticollis: Secondary | ICD-10-CM | POA: Diagnosis not present

## 2015-07-01 DIAGNOSIS — R293 Abnormal posture: Secondary | ICD-10-CM

## 2015-07-01 NOTE — Therapy (Signed)
[facility] [place], [place], [address] Phone: [phone]   Fax:  [phone]  Physical Therapy Treatment/Discharge Summary  Patient Details  Name: Susan [name] MRN: [id number] Date of Birth: [birth year] Referring Provider:  Shawna Orleans, Doe-Hyun Alfonso Patten, DO  Encounter Date: 07/01/2015      PT End of Session - 07/01/15 0821    Visit Number 4   Number of Visits 16   Date for PT Re-Evaluation 07/29/15   PT Start Time 0800   PT Stop Time 0825   PT Time Calculation (min) 25 min   Activity Tolerance Patient limited by pain      Past Medical History  Diagnosis Date  . Depression   . Anxiety   . Obesity   . Type II diabetes mellitus   . Anemia   . Arthritis   . Headache     Past Surgical History  Procedure Laterality Date  . Tubal ligation    . Wisdom tooth cyst removed      There were no vitals filed for this visit.  Visit Diagnosis:  Neck stiffness  Abnormal posture  Chronic tension-type headache, intractable  Chronic neck pain      Subjective Assessment - 07/01/15 0804    Subjective No better 8/10.  No benefit from dry needling.  My head all over and left > right neck.  Patient is unsmiling.   States nothing we have done in therapy has helped at all.     Currently in Pain? Yes   Pain Score 8    Pain Location Neck   Pain Orientation Left;Right   Pain Descriptors / Indicators Constant   Pain Type Chronic pain   Pain Onset More than a month ago   Pain Frequency Constant   Aggravating Factors  turning            [facility] PT Assessment - 07/01/15 0809    Observation/Other Assessments   Focus on Therapeutic Outcomes (FOTO)  61% limited   AROM   Cervical Flexion 50   Cervical Extension 45   Cervical - Right Side Bend 35   Cervical - Left Side Bend 35   Cervical - Right Rotation 33   Cervical - Left Rotation 17                             PT Education - 07/01/15 [id number]    Education provided Yes   Education Details discussion of heat vs. ice; myofascial pain; lack of expected progress   Person(s) Educated Patient   Methods Explanation   Comprehension Verbalized understanding          PT Short Term Goals - 07/01/15 0813    PT SHORT TERM GOAL #1   Title Pt will be I with HEP initial for cervical ROM, posture   Status Not Met   PT SHORT TERM GOAL #2   Title Pt will report improvement in headache frequency and intensity, 25% better   Status Not Met   PT SHORT TERM GOAL #3   Title Pt will be able to reach, use UEs for light activity and no increase in neck pain    Status Not Met           PT Long Term Goals - 07/01/15 9323    PT LONG TERM GOAL #1   Title Pt will be able to demo corrected posture and understand RICE, lifting, posture for prevention of  further injury.    PT LONG TERM GOAL #2   Title Pt will be able to turn head to 45 deg bilaterally without neck pain to drive   Status Not Met   PT LONG TERM GOAL #3   Title Pt will be able to work, carry mod sized bags with no increase in neck pain   Status Not Met   PT LONG TERM GOAL #4   Title Pt wil be able to report min occasional headaches   Baseline constant   Status Not Met   PT LONG TERM GOAL #5   Title FOTO score will decreased t0 <45% limited    Status Not Met               Plan - 07/01/15 0823    Clinical Impression Statement The patient reports no benefit from multiple interventions including gentle ROM, electrical stimuation, moist heat, manual therapy and dry needling.  Although she has had only 4 PT visits, I would have expected to see some change in subjective and objective measurements; however ROM is the same, FOTO outcome scoring is the same and pain remains 8/10.  Dry needling and other interventions were offered today but patient states, "it's not going to help anyway" and it was decided to discontinue PT and she would follow up with her doctor for other treatment  options.   Discharge from PT with no progress toward goals.          Problem List Patient Active Problem List   Diagnosis Date Noted  . Nonallopathic lesion of cervical region 04/25/2015  . Nonallopathic lesion of thoracic region 04/25/2015  . Nonallopathic lesion-rib cage 04/25/2015  . 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 Preventative health care 07/08/2014  . Food allergy 09/11/2012  . Diabetes mellitus type II, controlled 07/31/2012  . Glucosuria 07/28/2012  . Depression 08/21/2008  . Anxiety state 02/28/2008    Alvera Singh 07/01/2015, 8:35 AM  [facility] [address] [place], [place], [address] Phone: [phone]   Fax:  [phone]   PHYSICAL THERAPY DISCHARGE SUMMARY  Visits from Start of Care: 4  Current functional level related to goals / functional outcomes: See clinical impressions above   Remaining deficits: Continues to complain of severe, unchanging pain despite multiple interventions   Education / Equipment: Self care Plan: Patient agrees to discharge.  Patient goals were not met. Patient is being discharged due to lack of progress.  ?????       Ruben Im, PT 07/01/2015 8:37 AM Phone: [phone] Fax: [phone]

## 2015-07-03 ENCOUNTER — Ambulatory Visit: Payer: 59 | Admitting: Physical Therapy

## 2015-07-04 ENCOUNTER — Telehealth: Payer: Self-pay | Admitting: *Deleted

## 2015-07-04 ENCOUNTER — Telehealth: Payer: Self-pay | Admitting: Neurology

## 2015-07-04 NOTE — Telephone Encounter (Signed)
Pt returned your call @ [phone]

## 2015-07-04 NOTE — Telephone Encounter (Signed)
Patient called stating  Dr Everlena Cooper needs to state on the form he filled out or write a letter stating the exact time he does not want her to work she states she was working 830pm to 530 am her work is now offering her a shift of  800pm to 230am  .  I advised her to ask her company to send [place] a form  With a choice of times for each shift and Dr Everlena Cooper could choose for there

## 2015-07-07 NOTE — Telephone Encounter (Signed)
Pt wants to be called back [phone]/ requests to speak with [facility]

## 2015-07-07 NOTE — Telephone Encounter (Signed)
After a 20 minute conversation with patient I have again advised her as pre Dr Everlena Cooper she needs to get a letter from her job with the hours of work schedule so he may pick which would be benefit her better due to her headaches . She is wanting him to state she may only work during the day time which is not what  He has indicated is a restriction for her only third  shift is restriction for her

## 2015-07-08 ENCOUNTER — Telehealth: Payer: Self-pay | Admitting: Neurology

## 2015-07-08 ENCOUNTER — Ambulatory Visit: Payer: Self-pay | Admitting: Physical Therapy

## 2015-07-08 NOTE — Telephone Encounter (Signed)
Pt called and said she was retuning your call/Dawn CB# [phone]

## 2015-07-11 ENCOUNTER — Encounter: Payer: Self-pay | Admitting: Physical Therapy

## 2015-07-14 ENCOUNTER — Encounter: Payer: Self-pay | Admitting: Physical Therapy

## 2015-07-17 ENCOUNTER — Telehealth: Payer: Self-pay | Admitting: *Deleted

## 2015-07-17 NOTE — Telephone Encounter (Signed)
Error

## 2015-07-18 ENCOUNTER — Encounter: Payer: Self-pay | Admitting: *Deleted

## 2015-07-18 ENCOUNTER — Encounter: Payer: Self-pay | Admitting: Physical Therapy

## 2015-07-18 ENCOUNTER — Encounter: Payer: Self-pay | Admitting: Neurology

## 2015-07-22 ENCOUNTER — Encounter: Payer: Self-pay | Admitting: Physical Therapy

## 2015-07-25 ENCOUNTER — Encounter: Payer: Self-pay | Admitting: Physical Therapy

## 2015-07-29 ENCOUNTER — Ambulatory Visit (HOSPITAL_COMMUNITY): Payer: Self-pay | Admitting: Clinical

## 2015-07-31 ENCOUNTER — Ambulatory Visit (INDEPENDENT_AMBULATORY_CARE_PROVIDER_SITE_OTHER): Payer: PRIVATE HEALTH INSURANCE | Admitting: Clinical

## 2015-07-31 DIAGNOSIS — F4310 Post-traumatic stress disorder, unspecified: Secondary | ICD-10-CM | POA: Diagnosis not present

## 2015-07-31 DIAGNOSIS — F331 Major depressive disorder, recurrent, moderate: Secondary | ICD-10-CM | POA: Diagnosis not present

## 2015-07-31 NOTE — Progress Notes (Signed)
   THERAPIST PROGRESS NOTE  Session Time: 1:30 -2:30   Participation Level: Active  Behavioral Response: CasualAlertDepressed  Type of Therapy: Individual Therapy  Treatment Goals addressed: Improve psychiatric symptoms, elevate mood and functioning, Improve unhelpful thought patterns, Stress management  Interventions: CBT and Motivational Interviewing, grounding and mindfulness techniques  Summary: Susan [name] is a 44 y.o. female who presents with Major depressive disorder, recurrent episode, moderate and PTSD.   Suicidal/Homicidal: Nowithout intent/plan  Therapist Response: Rodena Piety met with clinician for an individual session. [name] discussed her psychiatric symptoms, her current life events, and her homework. [name] shared that she had forgotten her homework packet but that she had been practicing her grounding techniques. She was tearful when she expressed her desire to return to work. She shared about a meeting that she felt was handled poorly. She shared that she felt like she was being bulied. [name] described the situation and her and others reactions. She shared her disappointment and fear about not being able to return to work yet. Clinician asked if [name] was receiving any support. She shared that she has been fortunate to have a good group of friends that have helped her and her son make it through the difficult time. [name] shared about the ways they have helped her cope. Client and clinician discussed her use of grounding and mindfulness techniques. She shared that she was practicing but sometimes forgot when she needed them most. She shared that she was flooded with anxiety at her work meeting. She shared that she was unable to respond the way she would like to and that it wasn't until later that she thought to use her techniques. Client and clinician discussed practicing the techniques when she didn't need them so that they would be more readily available when she did. Clinician  introduced [name] to a new technique which client and clinician practiced together. [name] agreed to practice the techniques and bring her packets to next session.  Plan: Return again in 2-3 weeks.  Diagnosis:Axis I: Major depressive disorder, recurrent episode, moderate and PTSD   n.    Berdina Cheever A, LCSW 07/31/2015

## 2015-08-06 ENCOUNTER — Ambulatory Visit (INDEPENDENT_AMBULATORY_CARE_PROVIDER_SITE_OTHER): Payer: 59 | Admitting: Neurology

## 2015-08-06 ENCOUNTER — Encounter: Payer: Self-pay | Admitting: Neurology

## 2015-08-06 ENCOUNTER — Ambulatory Visit: Payer: Self-pay | Admitting: Neurology

## 2015-08-06 VITALS — BP 92/58 | HR 77 | Temp 98.3°F | Resp 16 | Ht 68.0 in | Wt 201.7 lb

## 2015-08-06 DIAGNOSIS — G44221 Chronic tension-type headache, intractable: Secondary | ICD-10-CM

## 2015-08-06 DIAGNOSIS — M542 Cervicalgia: Secondary | ICD-10-CM

## 2015-08-06 DIAGNOSIS — G43819 Other migraine, intractable, without status migrainosus: Secondary | ICD-10-CM

## 2015-08-06 MED ORDER — VENLAFAXINE HCL ER 150 MG PO CP24: 150.0000 mg | ORAL_CAPSULE | Freq: Every day | ORAL | Status: DC

## 2015-08-06 MED ORDER — RIZATRIPTAN BENZOATE 10 MG PO TBDP: ORAL_TABLET | ORAL | Status: DC

## 2015-08-06 NOTE — Progress Notes (Signed)
NEUROLOGY FOLLOW UP OFFICE NOTE  Susan [name] [id number]  HISTORY OF PRESENT ILLNESS: Susan [name] is a 44 year old right-handed woman with type II diabetes, anxiety, depression and migraine who follows up for migraine and neck pain.  UPDATE: I wrote letters and filled out forms/FMLA recommending that she not work third shift.  She just started first shift last week.  There has been no change in headaches.  They are constant. Current abortive therapy:  sumatriptan , tizanidine  (both ineffective) Current preventative therapy:  venlafaxine ER  Other therapy:  PT of neck, dry needling (ineffective)  HISTORY: Onset:  Has had migraines on and off for many years.  This episode recurred in June 2015, related to work-stressors. Location:  Bi-frontal and into the neck Quality:  Throbbing/aching Initial Intensity:  7/10 (10/10 when severe) Aura:  no Prodrome:  no Associated symptoms:  Nausea, photophobia, phonophobia, osmophobia, blurred vision. Initial Duration:  Constant but when severe, it lasts 3 days Initial Frequency:  Constant, but severe attacks occur 3 days per week. Triggers/exacerbating factors:  Smells of spicy foods, [place] food, [place] food, stress Relieving factors:  none Activity:  Feels that she needs to remain in bed for 4-5 days per week but forces self to get up to care for her son.  Has missed work all of February.  Past abortive therapy:  Excedrin migraine, ibuprofen, Aleve, Tylenol, tramadol, promethazine, Imitrex (tried it many years ago), Robaxin for neck pain Past preventative therapy:  Nortriptyline .  Previously was on Cymbalta for depression, which caused itching, chiropractor for neck pain (ineffective)  Caffeine:  3 days a week at most Alcohol:  no Smoker:  no Diet:  Good.  Stays hydrated. Exercise:  Walks daily Depression/stress:  Significant depression and anxiety.  Significant stress related to work.  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  She was on the night shift for many years and was granted the day shift, which made her feel better.  Then they put her on the night shift again and that is when the headaches and neck pain started.  She is currently in a lawsuit with her employer. Sleep hygiene:  Poor. Family history of headache:  no  PAST MEDICAL HISTORY: Past Medical History  Diagnosis Date  . Depression   . Anxiety   . Obesity   . Type II diabetes mellitus   . Anemia   . Arthritis   . Headache     MEDICATIONS: Current Outpatient Prescriptions on File Prior to Visit  Medication Sig Dispense Refill  . aspirin EC 81 MG tablet Take 1 tablet (81 mg total) by mouth daily.    Marland Kitchen glucose blood (BAYER CONTOUR NEXT TEST) test strip Once daily or as instructed 100 each 3  . metFORMIN (GLUCOPHAGE) 500 MG tablet TAKE 1.5 TABLETS (750 MG TOTAL) BY MOUTH 2 (TWO) TIMES DAILY WITH A MEAL. 270 tablet 1  . methocarbamol (ROBAXIN) 500 MG tablet Take 1 tablet (500 mg total) by mouth at bedtime as needed for muscle spasms. 30 tablet 0  . gabapentin (NEURONTIN) 100 MG capsule Take 1 capsule (100 mg total) by mouth at bedtime. (Patient not taking: Reported on 08/06/2015) 30 capsule 3  . pravastatin (PRAVACHOL) 20 MG tablet Take 1 tablet (20 mg total) by mouth daily. (Patient not taking: Reported on 08/06/2015) 90 tablet 1  . SUMAtriptan (IMITREX) 100 MG tablet Take 1tab at earliest onset of  headache.  May repeat once in 2 hours if headache persists or recurs. (Patient not taking: Reported on 05/20/2015) 10 tablet 2  . tiZANidine (ZANAFLEX) 2 MG tablet Take 1 tablet (2 mg total) by mouth every 8 (eight) hours as needed for muscle spasms. (Patient not taking: Reported on 06/09/2015) 30 tablet 0   Current Facility-Administered Medications on File Prior to Visit  Medication Dose Route Frequency Provider Last Rate Last Dose  . ibuprofen (ADVIL,MOTRIN) tablet 600 mg  600  mg Oral Once Pearline Cables, MD        ALLERGIES: Allergies  Allergen Reactions  . Cymbalta [Duloxetine Hcl] Rash  . Divalproex Sodium     REACTION: itching, hives  . Duloxetine     REACTION: itching, hives    FAMILY HISTORY: Family History  Problem Relation Age of Onset  . Asthma Mother   . Hypertension Mother   . Diabetes Mother   . Heart disease Father     CABG  . Asthma Father   . Breast cancer Maternal Grandmother   . Dementia Maternal Grandmother   . Colon cancer Neg Hx   . Anxiety disorder Brother   . Depression Brother     SOCIAL HISTORY: Social History   Social History  . Marital Status: Single    Spouse Name: N/A  . Number of Children: N/A  . Years of Education: N/A   Occupational History  . Not on file.   Social History Main Topics  . Smoking status: Former Games developer  . Smokeless tobacco: Never Used  . Alcohol Use: No  . Drug Use: No  . Sexual Activity: No   Other Topics Concern  . Not on file   Social History Narrative   Works at post office   38 year old son    REVIEW OF SYSTEMS: Constitutional: No fevers, chills, or sweats, no generalized fatigue, change in appetite Eyes: No visual changes, double vision, eye pain Ear, nose and throat: No hearing loss, ear pain, nasal congestion, sore throat Cardiovascular: No chest pain, palpitations Respiratory:  No shortness of breath at rest or with exertion, wheezes GastrointestinaI: No nausea, vomiting, diarrhea, abdominal pain, fecal incontinence Genitourinary:  No dysuria, urinary retention or frequency Musculoskeletal:  Neck pain Integumentary: No rash, pruritus, skin lesions Neurological: as above Psychiatric: depression, insomnia, anxiety Endocrine: No palpitations, fatigue, diaphoresis, mood swings, change in appetite, change in weight, increased thirst Hematologic/Lymphatic:  No anemia, purpura, petechiae. Allergic/Immunologic: no itchy/runny eyes, nasal congestion, recent allergic  reactions, rashes  PHYSICAL EXAM: Filed Vitals:   08/06/15 1533  BP: 92/58  Pulse: 77  Temp: 98.3 F (36.8 C)  Resp: 16   General: No acute distress.  Patient appears well-groomed.  Head:  Normocephalic/atraumatic Eyes:  Fundoscopic exam unremarkable without vessel changes, exudates, hemorrhages or papilledema. Neck: supple, no paraspinal tenderness, full range of motion Heart:  Regular rate and rhythm Lungs:  Clear to auscultation bilaterally Back: No paraspinal tenderness Neurological Exam: alert and oriented to person, place, and time. Attention span and concentration intact, recent and remote memory intact, fund of knowledge intact.  Speech fluent and not dysarthric, language intact.  CN II-XII intact. Fundoscopic exam unremarkable without vessel changes, exudates, hemorrhages or papilledema.  Bulk and tone normal, muscle strength 5/5 throughout.  Sensation to light touch, temperature and vibration intact.  Deep tendon reflexes 2+ throughout.  Finger to nose and heel to shin testing intact.  Gait normal.  IMPRESSION: Chronic migraine without aura, intractable Chronic tension-type headache Neck pain  Hopefully she will see improvement now that she is working first shift.  PLAN: Increase venlafaxine ER to  daily and she is to call in 4 weeks with update.   Will try Maxalt  15 minutes spent face to face with patient, over 50% spent discussing management.  Susan Millet, DO  CC:  Thomos Lemons, MD

## 2015-08-06 NOTE — Patient Instructions (Signed)
Migraine Recommendations: 1.  Increase venlafaxine ER to  daily.  Call in 4 weeks with update and we can adjust dose if needed. 2.  Take Maxalt  at earliest onset of headache.  May repeat dose once in 2 hours if needed.  Do not exceed two tablets in 24 hours. 3.  Limit use of pain relievers to no more than 2 days out of the week.  These medications include acetaminophen, ibuprofen, triptans and narcotics.  This will help reduce risk of rebound headaches. 4.  Be aware of common food triggers such as processed sweets, processed foods with nitrites (such as deli meat, hot dogs, sausages), foods with MSG, alcohol (such as wine), chocolate, certain cheeses, certain fruits (dried fruits, some citrus fruit), vinegar, diet soda. 4.  Avoid caffeine 5.  Routine exercise 6.  Proper sleep hygiene 7.  Stay adequately hydrated with water 8.  Keep a headache diary. 9.  Maintain proper stress management. 10.  Do not skip meals. 11.  Consider supplements:  Magnesium oxide  to  daily, riboflavin , Coenzyme Q 10  three times daily 12.  Follow up in 3 months.  CALL IN 4 WEEKS WITH UPDATE

## 2015-08-07 ENCOUNTER — Encounter (HOSPITAL_COMMUNITY): Payer: Self-pay | Admitting: Clinical

## 2015-08-12 ENCOUNTER — Ambulatory Visit (HOSPITAL_COMMUNITY): Payer: Self-pay | Admitting: Clinical

## 2015-08-26 ENCOUNTER — Ambulatory Visit (INDEPENDENT_AMBULATORY_CARE_PROVIDER_SITE_OTHER): Payer: PRIVATE HEALTH INSURANCE | Admitting: Clinical

## 2015-08-26 ENCOUNTER — Encounter (HOSPITAL_COMMUNITY): Payer: Self-pay | Admitting: Clinical

## 2015-08-26 ENCOUNTER — Ambulatory Visit (HOSPITAL_COMMUNITY): Payer: Self-pay | Admitting: Clinical

## 2015-08-26 DIAGNOSIS — F4310 Post-traumatic stress disorder, unspecified: Secondary | ICD-10-CM

## 2015-08-26 DIAGNOSIS — F331 Major depressive disorder, recurrent, moderate: Secondary | ICD-10-CM

## 2015-08-26 NOTE — Progress Notes (Signed)
   THERAPIST PROGRESS NOTE  Session Time: 1:35 - 2:35  Participation Level: Active  Behavioral Response: CasualAlertDepressed  Behavioral Response: CasualAlertDepressed  Type of Therapy: Individual Therapy  Treatment Goals addressed: Improve psychiatric symptoms, elevate mood and functioning, Improve unhelpful thought patterns, Stress management  Interventions: CBT and Motivational Interviewing, grounding and mindfulness techniques  Summary: Susan [name] is a 44 y.o. female who presents with Major depressive disorder, recurrent episode, moderate and PTSD.   Suicidal/Homicidal: Nowithout intent/plan  Therapist Response: Rodena Piety met with clinician for an individual session. [name] discussed her psychiatric symptoms, her current life events, and her homework. [name] shared that she had completed her homework packet on depression. Client and clinician discussed and reviewed her packet. [name] shared about the her symptoms and the things in the packet that she identified with. [name] said  "this sounds like what I have. It discuss a lot of the symptoms I experience." [name] listed her symptoms which included insomnia. Client and clinician discussed some sleep hygiene which [name] was willing to try. [name] shared that she return to work recently. She shared that she felt really good the first week but was having some trouble this week. She shared that her shoulders were spasming. She also shared about increased stress about her lawsuits, difficulty with unemployment approval. and the paperwork that she needs to do. 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 Rodena Piety and clinician discussed her practice of grounding and mindfulness techniques. [name] shared that she liked one called pat and practiced it often and found it calming. Client and clinician discussed a mindfulness technique. Client and clinician practice the technique together, first with  clinician showing demonstrating how to do the technique and then with Rodena Piety showing clinician how to practice the technique. Clinician introduced the idea of keeping a  gratitude journal. Clinician explained the purpose and practice of doing so and some of the findings on how it might help elevate mood. [name] agreed to keep a journal , complete a another packet , and to continue to practice grounding and mindfulness techniques until next session.   Plan: Return again in 2-3 weeks.  Diagnosis:Axis I: Major depressive disorder, recurrent episode, moderate and PTSD   Doyt Castellana A, LCSW 08/26/2015

## 2015-10-29 ENCOUNTER — Encounter (HOSPITAL_COMMUNITY): Payer: Self-pay | Admitting: Clinical

## 2015-10-29 ENCOUNTER — Ambulatory Visit (INDEPENDENT_AMBULATORY_CARE_PROVIDER_SITE_OTHER): Payer: PRIVATE HEALTH INSURANCE | Admitting: Clinical

## 2015-10-29 DIAGNOSIS — F331 Major depressive disorder, recurrent, moderate: Secondary | ICD-10-CM

## 2015-10-29 DIAGNOSIS — F4310 Post-traumatic stress disorder, unspecified: Secondary | ICD-10-CM | POA: Diagnosis not present

## 2015-10-29 NOTE — Progress Notes (Signed)
   THERAPIST PROGRESS NOTE  Session Time: 10:03 -10:57  Participation Level: Active  Behavioral Response: CasualAlertDepressed  Type of Therapy: Individual Therapy  Treatment Goals addressed: Improve psychiatric symptoms, elevate mood and functioning, Improve unhelpful thought patterns, Stress management  Interventions: CBT and Motivational Interviewing, grounding and mindfulness techniques  Summary: Susan [name] is a 44 y.o. female who presents with Major depressive disorder, recurrent episode, moderate and PTSD.   Suicidal/Homicidal: No - without intent/plan  Therapist Response: Rodena Piety met with clinician for an individual session. Layni discussed her psychiatric symptoms and her current life events. Aahana shared that she has not attended therapy recently because her car had broken down and she has to rely on others for rides. She shared that she is still very challenged at work though she has been put on the day shift. She shared about another employee saying something to her which she took as harassment  Because he is not suppose to be speaking to her. Kinnedy shared her response to him. Client and clinician discussed whether or not her response made the day better or worse for her. Rosalyn had the insight that if she responded differently she might have felt better. She shared she still does not have a court date for her settlement but was approved for her unemployment which has helped her financially. Nakina shared that she runs conversations over and over in her head which makes her feel fatigued. Rodena Piety and clinician discussed the pros and cons of doing so. Rodena Piety and clinician discussed some techniques for interrupting and redirecting her thoughts (grounding techniques) client and clinician practiced a technique together. Client and clinician also discussed her gratitude journal. Anyla shared that she falls asleep before completing it. Client and clinician discussed adjustments to her practice  so that she could get the benefit. Rodena Piety asked clarifying questions which clinician answered. Addilee agreed to practice her techniques and keep a gratitude journal until next session.   Plan: Return again in 2-3 weeks.  Diagnosis:     Axis I: Major depressive disorder, recurrent episode, moderate and PTSD   Gemma Ruan A, LCSW 10/29/2015

## 2015-11-21 ENCOUNTER — Encounter: Payer: Self-pay | Admitting: Neurology

## 2015-11-21 ENCOUNTER — Ambulatory Visit (INDEPENDENT_AMBULATORY_CARE_PROVIDER_SITE_OTHER): Payer: 59 | Admitting: Neurology

## 2015-11-21 VITALS — BP 110/56 | HR 73 | Ht 68.0 in | Wt 214.0 lb

## 2015-11-21 DIAGNOSIS — G43019 Migraine without aura, intractable, without status migrainosus: Secondary | ICD-10-CM | POA: Insufficient documentation

## 2015-11-21 MED ORDER — DIPHENHYDRAMINE HCL 50 MG/ML IJ SOLN
25.0000 mg | Freq: Once | INTRAMUSCULAR | Status: AC
  Administered 2015-11-21: 25 mg via INTRAVENOUS

## 2015-11-21 MED ORDER — KETOROLAC TROMETHAMINE 60 MG/2ML IM SOLN
60.0000 mg | Freq: Once | INTRAMUSCULAR | Status: AC
  Administered 2015-11-21: 60 mg via INTRAMUSCULAR

## 2015-11-21 MED ORDER — NAPROXEN SODIUM 550 MG PO TABS: 550.0000 mg | ORAL_TABLET | Freq: Two times a day (BID) | ORAL | Status: DC | PRN

## 2015-11-21 MED ORDER — METOCLOPRAMIDE HCL 5 MG/ML IJ SOLN
10.0000 mg | Freq: Once | INTRAVENOUS | Status: AC
  Administered 2015-11-21: 10 mg via INTRAMUSCULAR

## 2015-11-21 NOTE — Addendum Note (Signed)
Addended by: Sheilah MinsFOX, JADA A on: 11/21/2015 02:53 PM   Modules accepted: Orders

## 2015-11-21 NOTE — Patient Instructions (Signed)
1.  Take Maxalt with naproxen 550mg  at earliest onset of headache.  May repeat once in 2 hours if needed.  Do not exceed 2 doses in 24 hours 2.  Continue venlafaxine ER 150mg  daily 3.  Will give you a headache cocktail here in the office (Toradol 60mg , Benadryl 25mg  and Reglan 10mg ).  It may cause drowsiness, so have your son drive you home. 4.  If Maxalt and naproxen ineffective, call the office and we can try another medication (such as the shot or nasal spray) 5.  Follow up in 3 months but call sooner if needed.

## 2015-11-21 NOTE — Progress Notes (Signed)
NEUROLOGY FOLLOW UP OFFICE NOTE  WEALTHY DANIELSKI [id number]  HISTORY OF PRESENT ILLNESS: Susan [name] is a 45 year old right-handed woman with type II diabetes, anxiety, depression and migraine who follows up for migraine  UPDATE: Headaches have decreased in frequency but each migraine is lasting longer.  Currently with migraine since Wednesday Duration: 3 to 4 days Frequency:  2 to 3 times a month (approximately 15 headache days per month) Current abortive therapy:  Maxalt (took once, made her groggy). Current antidepressant:  venlafaxine ER 150mg  Current anticonvulsant:  Gabapentin 100mg  at bedtime I wrote letters and filled out forms/FMLA recommending that she not work third shift  HISTORY: Onset:  Has had migraines on and off for many years.  This episode recurred in June 2015, related to work-stressors. Location:  Bi-frontal and into the neck Quality:  Throbbing/aching Initial Intensity:  7/10 (10/10 when severe) Aura:  no Prodrome:  no Associated symptoms:  Nausea, photophobia, phonophobia, osmophobia, blurred vision. Initial Duration:  Constant but when severe, it lasts 3 days Initial Frequency:  Constant, but severe attacks occur 3 days per week. Triggers/exacerbating factors:  Smells of spicy foods, [place] food, [place] food, stress Relieving factors:  none Activity:  Feels that she needs to remain in bed for 4-5 days per week but forces self to get up to care for her son.  Has missed work all of February.  Past abortive therapy:  Excedrin migraine, ibuprofen, Aleve, Tylenol, tramadol, promethazine, sumatriptan 100mg , tizanidine, Robaxin for neck pain Past preventative therapy:  Nortriptyline 75mg .  Previously was on Cymbalta for depression, which caused itching, chiropractor for neck pain (ineffective), dry needling, PT of neck  Caffeine:  3 days a week at most Alcohol:  no Smoker:  no Diet:  Good.  Stays hydrated. Exercise:  Walks daily Depression/stress:   Significant depression and anxiety.  Significant stress related to work.  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  She was on the night shift for many years and was granted the day shift, which made her feel better.  Then they put her on the night shift again and that is when the headaches and neck pain started.  She is currently in a lawsuit with her employer. Sleep hygiene:  Poor. Family history of headache:  no  PAST MEDICAL HISTORY: Past Medical History  Diagnosis Date  . Depression   . Anxiety   . Obesity   . Type II diabetes mellitus (HCC)   . Anemia   . Arthritis   . Headache     MEDICATIONS: Current Outpatient Prescriptions on File Prior to Visit  Medication Sig Dispense Refill  . aspirin EC 81 MG tablet Take 1 tablet (81 mg total) by mouth daily.    Marland Kitchen gabapentin (NEURONTIN) 100 MG capsule Take 1 capsule (100 mg total) by mouth at bedtime. 30 capsule 3  . metFORMIN (GLUCOPHAGE) 500 MG tablet TAKE 1.5 TABLETS (750 MG TOTAL) BY MOUTH 2 (TWO) TIMES DAILY WITH A MEAL. 270 tablet 1  . pravastatin (PRAVACHOL) 20 MG tablet Take 1 tablet (20 mg total) by mouth daily. 90 tablet 1  . venlafaxine XR (EFFEXOR-XR) 150 MG 24 hr capsule Take 1 capsule (150 mg total) by mouth daily with breakfast. 30 capsule 0  . glucose blood (BAYER CONTOUR NEXT TEST) test strip Once daily or as instructed (Patient not taking: Reported on 11/21/2015) 100 each 3  . methocarbamol (ROBAXIN) 500 MG tablet  Take 1 tablet (500 mg total) by mouth at bedtime as needed for muscle spasms. (Patient not taking: Reported on 08/26/2015) 30 tablet 0  . rizatriptan (MAXALT-MLT) 10 MG disintegrating tablet Take 1tab for headache.  May repeat x1 in 2 hours if needed.  Do not exceed 2tabs in 24hrs (Patient not taking: Reported on 11/21/2015) 9 tablet 11  . SUMAtriptan (IMITREX) 100 MG tablet Take 1tab at earliest onset of headache.  May repeat once in 2 hours if  headache persists or recurs. (Patient not taking: Reported on 05/20/2015) 10 tablet 2  . tiZANidine (ZANAFLEX) 2 MG tablet Take 1 tablet (2 mg total) by mouth every 8 (eight) hours as needed for muscle spasms. (Patient not taking: Reported on 06/09/2015) 30 tablet 0   Current Facility-Administered Medications on File Prior to Visit  Medication Dose Route Frequency Provider Last Rate Last Dose  . ibuprofen (ADVIL,MOTRIN) tablet 600 mg  600 mg Oral Once Pearline CablesJessica C Copland, MD        ALLERGIES: Allergies  Allergen Reactions  . Cymbalta [Duloxetine Hcl] Rash  . Divalproex Sodium     REACTION: itching, hives  . Duloxetine     REACTION: itching, hives    FAMILY HISTORY: Family History  Problem Relation Age of Onset  . Asthma Mother   . Hypertension Mother   . Diabetes Mother   . Heart disease Father     CABG  . Asthma Father   . Breast cancer Maternal Grandmother   . Dementia Maternal Grandmother   . Colon cancer Neg Hx   . Anxiety disorder Brother   . Depression Brother     SOCIAL HISTORY: Social History   Social History  . Marital Status: Single    Spouse Name: N/A  . Number of Children: N/A  . Years of Education: N/A   Occupational History  . Not on file.   Social History Main Topics  . Smoking status: Former Games developermoker  . Smokeless tobacco: Never Used  . Alcohol Use: No  . Drug Use: No  . Sexual Activity: No   Other Topics Concern  . Not on file   Social History Narrative   Works at post office   47 year old son    REVIEW OF SYSTEMS: Constitutional: No fevers, chills, or sweats, no generalized fatigue, change in appetite Eyes: No visual changes, double vision, eye pain Ear, nose and throat: No hearing loss, ear pain, nasal congestion, sore throat Cardiovascular: No chest pain, palpitations Respiratory:  No shortness of breath at rest or with exertion, wheezes GastrointestinaI: No nausea, vomiting, diarrhea, abdominal pain, fecal incontinence Genitourinary:   No dysuria, urinary retention or frequency Musculoskeletal:  No neck pain, back pain Integumentary: No rash, pruritus, skin lesions Neurological: as above Psychiatric: No depression, insomnia, anxiety Endocrine: No palpitations, fatigue, diaphoresis, mood swings, change in appetite, change in weight, increased thirst Hematologic/Lymphatic:  No anemia, purpura, petechiae. Allergic/Immunologic: no itchy/runny eyes, nasal congestion, recent allergic reactions, rashes  PHYSICAL EXAM: Filed Vitals:   11/21/15 1356  BP: 110/56  Pulse: 73   General: No acute distress.  Patient appears well-groomed.   Head:  Normocephalic/atraumatic Eyes:  Fundoscopic exam unremarkable without vessel changes, exudates, hemorrhages or papilledema. Neck: supple, no paraspinal tenderness, full range of motion Heart:  Regular rate and rhythm Lungs:  Clear to auscultation bilaterally Back: No paraspinal tenderness Neurological Exam: alert and oriented to person, place, and time. Attention span and concentration intact, recent and remote memory intact, fund of knowledge intact.  Speech fluent and not dysarthric, language intact.  Endorses right cheek feels tingling.  Otherwise, CN II-XII intact. Fundoscopic exam unremarkable without vessel changes, exudates, hemorrhages or papilledema.  Bulk and tone normal, muscle strength 5/5 throughout.  Sensation to light touch, temperature and vibration intact.  Deep tendon reflexes 2+ throughout.  Finger to nose and heel to shin testing intact.  Gait normal.  IMPRESSION: Intractable migraine without aura  PLAN: 1.  She would like to try Maxalt one more time.  She will take it with naproxen 550mg .  If ineffective, then will try sumatriptan NS or [place], or Zomig NS. 2.  Continue venlafaxine ER 150mg  daily 3.  Will give headache cocktail (Toradol 60mg Benito Mccreedy 25mg /Reglan 10mg ).  Her son will drive her home. 4.  Follow up in 3 months.  15 minutes spent face to face with patient,  over 50% spent discussing management.  Shon Millet, DO  CC:  Thomos Lemons, MD

## 2015-11-25 ENCOUNTER — Ambulatory Visit: Payer: Self-pay | Admitting: Neurology

## 2015-11-26 ENCOUNTER — Ambulatory Visit (INDEPENDENT_AMBULATORY_CARE_PROVIDER_SITE_OTHER): Payer: PRIVATE HEALTH INSURANCE | Admitting: Clinical

## 2015-11-26 ENCOUNTER — Encounter (HOSPITAL_COMMUNITY): Payer: Self-pay | Admitting: Clinical

## 2015-11-26 ENCOUNTER — Other Ambulatory Visit (INDEPENDENT_AMBULATORY_CARE_PROVIDER_SITE_OTHER): Payer: Self-pay

## 2015-11-26 DIAGNOSIS — F4310 Post-traumatic stress disorder, unspecified: Secondary | ICD-10-CM

## 2015-11-26 DIAGNOSIS — I10 Essential (primary) hypertension: Secondary | ICD-10-CM | POA: Diagnosis not present

## 2015-11-26 DIAGNOSIS — F331 Major depressive disorder, recurrent, moderate: Secondary | ICD-10-CM | POA: Diagnosis not present

## 2015-11-26 DIAGNOSIS — E119 Type 2 diabetes mellitus without complications: Secondary | ICD-10-CM

## 2015-11-26 LAB — BASIC METABOLIC PANEL
BUN: 12 mg/dL (ref 6–23)
CALCIUM: 9.7 mg/dL (ref 8.4–10.5)
CO2: 28 mEq/L (ref 19–32)
CREATININE: 0.75 mg/dL (ref 0.40–1.20)
Chloride: 106 mEq/L (ref 96–112)
GFR: 106.78 mL/min (ref >60.00)
GLUCOSE: 112 mg/dL — AB (ref 70–99)
Potassium: 4.4 mEq/L (ref 3.5–5.1)
Sodium: 140 mEq/L (ref 135–145)

## 2015-11-26 LAB — HEMOGLOBIN A1C: HEMOGLOBIN A1C: 6.4 % (ref 4.6–6.5)

## 2015-11-28 NOTE — Progress Notes (Addendum)
   THERAPIST PROGRESS NOTE  Session Time: 8:01 - 530  Participation Level: Active  Behavioral Response: CasualAlertDepressed  Type of Therapy: Individual Therapy  Treatment Goals addressed: Improve psychiatric symptoms, elevate mood and functioning, Improve unhelpful thought patterns, Stress management  Interventions: CBT and Motivational Interviewing, grounding and mindfulness techniques  Summary: Susan [name] is a 45 y.o. female who presents with Major depressive disorder, recurrent episode, moderate and PTSD.   Suicidal/Homicidal: Nowithout intent/plan  Therapist Response: Rodena Piety met with clinician for an individual session. [name] discussed her psychiatric symptoms,her current life events, and her homework. [name] shared that she had been keeping her gratitude journal. She shared the things she wrote about. Client and clinician discussed the process and her insights. [name] shared that a lot of the things that make her feel positive are related to her son and activities in the church. She shared however she does not share a lot with the people at the church because of gossip and the like. Though [name] shared these made her feel positive her face expression did not show it. [name] shared that things at work are much the same. She shared that she is concerned because the person she has a case with may be leaving and she is unsure of how this might affect her case. Client and clinician discussed the level of interaction she has with others. She shared that she often works alone. Client and clinician discussed the things that occupy her mind when she is working alone. Rodena Piety and clinician discussed her grounding and mindfulness techniques and the option to focus her mind on positive things rather than replaying old conversations. Client and clinician discussed the process of choosing her focus - not to pretend that the negative things don't happen, but not to focus on them when there is not an action  to be taken. Clinician gave [name] a homework packet on depression which she agreed to complete before next session in addition to continuing her grounding and mindfulness techniques and continuing her gratitude journal.  Rodena Piety agreed to practice her techniques and keep a gratitude journal until next session  Plan: Return again in 2-3 weeks.  Diagnosis:Axis I: Major depressive disorder, recurrent episode, moderate and PTSD    Jerel Shepherd, LCSW 11/26/15

## 2015-12-03 ENCOUNTER — Other Ambulatory Visit: Payer: Self-pay

## 2015-12-10 ENCOUNTER — Ambulatory Visit: Payer: Self-pay | Admitting: Internal Medicine

## 2015-12-16 ENCOUNTER — Telehealth: Payer: Self-pay | Admitting: Internal Medicine

## 2015-12-16 NOTE — Telephone Encounter (Signed)
Patient would like a refill on her Metformin medication. She would also like the results of her recent labs.

## 2015-12-17 NOTE — Telephone Encounter (Signed)
Left message on machine for patient to return our call 

## 2015-12-17 NOTE — Telephone Encounter (Signed)
Her A1c is somewhat higher at 6.4.  Last a1c was 5.8.  Her electrolytes and kidney function are stable.  Ok to provide RF for metformin (3 month supply) with 1 RF.  Encourage pt to sign up for mychart so she can review her labs herself

## 2015-12-18 NOTE — Telephone Encounter (Signed)
Left message on machine for patient to return our call 

## 2015-12-19 NOTE — Telephone Encounter (Signed)
Left message on machine for patient to return our call.  Letter was sent for patient to return our call.

## 2015-12-29 ENCOUNTER — Ambulatory Visit (INDEPENDENT_AMBULATORY_CARE_PROVIDER_SITE_OTHER): Payer: PRIVATE HEALTH INSURANCE | Admitting: Clinical

## 2015-12-29 ENCOUNTER — Encounter (HOSPITAL_COMMUNITY): Payer: Self-pay | Admitting: Clinical

## 2015-12-29 DIAGNOSIS — F4310 Post-traumatic stress disorder, unspecified: Secondary | ICD-10-CM

## 2015-12-29 DIAGNOSIS — F331 Major depressive disorder, recurrent, moderate: Secondary | ICD-10-CM

## 2015-12-29 NOTE — Progress Notes (Signed)
   THERAPIST PROGRESS NOTE  Session Time: 4:35 -5:35  Participation Level: Active  Behavioral Response: CasualAlertDepressed  Type of Therapy: Individual Therapy  Treatment Goals addressed: Improve psychiatric symptoms, elevate mood and functioning, Improve unhelpful thought patterns, Stress management  Interventions: CBT and Motivational Interviewing,  mindfulness techniques  Summary: Susan [name] is a 45 y.o. female who presents with Major depressive disorder, recurrent episode, moderate and PTSD.   Suicidal/Homicidal: No - without intent/plan  Therapist Response: Rodena Piety met with clinician for an individual session. [name] discussed her psychiatric symptoms, her current life events, and her homework. [name] shared that she was feeling slightly better than at last session. She looked less depressed than usual. She shared that she has been keeping her gratitude journal and is finding it helpful. She shared it is helping her to improve her mood. She shared that she has been using her grounding and mindfulness techniques. She shared that she has improved slightly in being able to focus her attention away from her negative thoughts. Rodena Piety and clinician discussed her homework packet. She shared her thoughts and insights from the homework packet. [name] had some questions about the packet which clinician answered. Clinician presented an additional packet which [name] complete before next session. Client and clinician discussed stress management techniques. Client and clinician discussed mindfulness techniques. [name] shared what was currently working. Clinician introduced additional techniques which client and clinician practiced together. [name] shared that things are the same at work. She shared her frustration a bout bidding on a job, getting accepted, and then having it taken from her due to a clerical  error by the job poster. Client and clinician discussed what was in her power to change and what was  not. [name] shared that she planned to apply for the job again when it was reposted.    Plan: Return again in 2-3 weeks.  Diagnosis:     Axis I: Major depressive disorder, recurrent episode, moderate and PTSD    Burlon Centrella A, LCSW 12/29/2015

## 2015-12-30 ENCOUNTER — Telehealth: Payer: Self-pay | Admitting: Internal Medicine

## 2015-12-30 DIAGNOSIS — E119 Type 2 diabetes mellitus without complications: Secondary | ICD-10-CM

## 2015-12-30 NOTE — Telephone Encounter (Signed)
Ok for LFTs and FLP.  It looks like she had these labs last drawn in 05/2015

## 2015-12-30 NOTE — Telephone Encounter (Signed)
Pt states she would also like to have A1C rechecked at the 4/11 lab appt.  Pt states it was elevated last time. Is it ok to add?

## 2015-12-30 NOTE — Telephone Encounter (Signed)
Pt has been sch for April 2017

## 2015-12-30 NOTE — Telephone Encounter (Signed)
Pt had lab work on 11-26-15 and chole was not drawn. Can I sch?

## 2015-12-31 NOTE — Telephone Encounter (Signed)
Ok to add A1c

## 2015-12-31 NOTE — Telephone Encounter (Signed)
Order placed

## 2016-01-05 ENCOUNTER — Telehealth: Payer: Self-pay | Admitting: Neurology

## 2016-01-05 NOTE — Telephone Encounter (Signed)
Ok to write letter. Is the Maxalt helping? If not, we can try Zomig nasal spray ( , 1 spray in nostril at earliest onset of headache. May repeat once in 2 hours if needed. Not to exceed 2 sprays in 24 hours. We can give her a sample

## 2016-01-05 NOTE — Telephone Encounter (Signed)
Missed work on Saturday morning due to migraine and being unable to sleep the night before. Would like a note for work. Please advise.

## 2016-01-05 NOTE — Telephone Encounter (Signed)
Pt needs a note for her missing work in sat please call her at [phone]

## 2016-01-06 MED ORDER — ZOLMITRIPTAN 5 MG NA SOLN: 1.0000 | NASAL | Status: DC | PRN

## 2016-01-06 NOTE — Telephone Encounter (Signed)
Spoke with patient. Maxalt not helping. Would like rx for Zomig. Letter written and dropped in mail. RX placed.

## 2016-01-06 NOTE — Telephone Encounter (Signed)
Left message on machine for pt to return call to the office. Need to know how she would like to receive letter. Also, is Maxalt helping?

## 2016-01-15 ENCOUNTER — Telehealth: Payer: Self-pay

## 2016-01-15 NOTE — Telephone Encounter (Signed)
Left message on machine for pt to return call to the office.  

## 2016-01-15 NOTE — Telephone Encounter (Signed)
Pat called and stated that the Zomig nasal spray is not helping and would you please Call her.

## 2016-02-12 ENCOUNTER — Ambulatory Visit (HOSPITAL_COMMUNITY): Payer: Self-pay | Admitting: Clinical

## 2016-02-17 ENCOUNTER — Telehealth: Payer: Self-pay

## 2016-02-17 NOTE — Telephone Encounter (Signed)
OK 

## 2016-02-17 NOTE — Telephone Encounter (Signed)
Pt dropped off FMLA paperwork to complete. Pt is requesting that this cover absences from 02/12/16 - 02/16/16 (this past Thursday - Today). Please advise.

## 2016-02-23 ENCOUNTER — Telehealth: Payer: Self-pay | Admitting: Neurology

## 2016-02-23 NOTE — Telephone Encounter (Signed)
It is. Form faxed.

## 2016-02-23 NOTE — Telephone Encounter (Signed)
Susan [name] February 14, 2071 called wanting to let you know that the fax number is on the front page of the FMLA  form. If you have any questions her number is [phone].  Thank you

## 2016-02-23 NOTE — Telephone Encounter (Signed)
Paperwork completed. Called to find out where to fax form to. No answer. Will leave form at my desk until return phone call.

## 2016-02-24 ENCOUNTER — Other Ambulatory Visit: Payer: Self-pay

## 2016-03-01 DIAGNOSIS — Z029 Encounter for administrative examinations, unspecified: Secondary | ICD-10-CM

## 2016-03-05 ENCOUNTER — Telehealth: Payer: Self-pay | Admitting: Neurology

## 2016-03-05 NOTE — Telephone Encounter (Signed)
Susan [name] [id number] called today 03/05/16. She would like copies of her FMLA paperwork. She said she hadn't received any copies. Her call back number is [phone]. She is coming in on Tuesday 03/09/16 to see Dr. Everlena CooperJaffe for an appointment.

## 2016-03-08 NOTE — Telephone Encounter (Signed)
Will have paperwork gather for pt for her upcoming appointment tomorrow.

## 2016-03-09 ENCOUNTER — Ambulatory Visit (INDEPENDENT_AMBULATORY_CARE_PROVIDER_SITE_OTHER): Payer: 59 | Admitting: Neurology

## 2016-03-09 ENCOUNTER — Encounter: Payer: Self-pay | Admitting: Neurology

## 2016-03-09 VITALS — BP 130/74 | HR 74 | Ht 68.0 in | Wt 215.0 lb

## 2016-03-09 DIAGNOSIS — G43719 Chronic migraine without aura, intractable, without status migrainosus: Secondary | ICD-10-CM | POA: Diagnosis not present

## 2016-03-09 MED ORDER — VENLAFAXINE HCL ER 75 MG PO CP24: 75.0000 mg | ORAL_CAPSULE | Freq: Every day | ORAL | Status: DC

## 2016-03-09 MED ORDER — ZOLMITRIPTAN 2.5 MG NA SOLN: 1.0000 | NASAL | Status: DC | PRN

## 2016-03-09 MED ORDER — TOPIRAMATE 50 MG PO TABS: 50.0000 mg | ORAL_TABLET | Freq: Every day | ORAL | Status: DC
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
## 2016-03-09 NOTE — Patient Instructions (Signed)
1.  Stop venlafaxine ER 150mg  tablets.  Instead, take 75mg  daily for 7 days and then stop. 2.  We will start topiramate (Topamax) 50mg  at bedtime. Possible side effects include: impaired thinking, sedation, paresthesias (numbness and tingling) and weight loss.  It may cause dehydration and there is a small risk for kidney stones, so make sure to stay hydrated with water during the day.  There is also a very small risk for glaucoma, so if you notice any change in your vision while taking this medication, see an ophthalmologist.   CALL IN 4 WEEKS WITH UPDATE AND WE CAN INCREASE DOSE IF NEEDED. 3.  At earliest onset of headache, take Zomig 5mg  nasal spray, 1 spray in one nostril.  May repeat dose in 2 hours if needed (do not exceed 2 sprays in 24 hours)

## 2016-03-11 ENCOUNTER — Ambulatory Visit (INDEPENDENT_AMBULATORY_CARE_PROVIDER_SITE_OTHER): Payer: PRIVATE HEALTH INSURANCE | Admitting: Clinical

## 2016-03-11 ENCOUNTER — Encounter (HOSPITAL_COMMUNITY): Payer: Self-pay | Admitting: Clinical

## 2016-03-11 DIAGNOSIS — F331 Major depressive disorder, recurrent, moderate: Secondary | ICD-10-CM

## 2016-03-11 DIAGNOSIS — F4310 Post-traumatic stress disorder, unspecified: Secondary | ICD-10-CM | POA: Diagnosis not present
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
## 2016-03-15 ENCOUNTER — Ambulatory Visit (HOSPITAL_COMMUNITY): Payer: Self-pay | Admitting: Clinical

## 2016-03-22 ENCOUNTER — Encounter: Payer: Self-pay | Admitting: Neurology

## 2016-05-20 ENCOUNTER — Ambulatory Visit (HOSPITAL_COMMUNITY): Payer: Self-pay | Admitting: Clinical

## 2016-06-09 ENCOUNTER — Encounter (HOSPITAL_COMMUNITY): Payer: Self-pay | Admitting: Clinical

## 2016-06-09 ENCOUNTER — Ambulatory Visit (INDEPENDENT_AMBULATORY_CARE_PROVIDER_SITE_OTHER): Payer: Self-pay | Admitting: Clinical

## 2016-06-09 DIAGNOSIS — F331 Major depressive disorder, recurrent, moderate: Secondary | ICD-10-CM

## 2016-06-09 DIAGNOSIS — F4310 Post-traumatic stress disorder, unspecified: Secondary | ICD-10-CM

## 2016-06-09 NOTE — Progress Notes (Signed)
   THERAPIST PROGRESS NOTE  Session Time: 1:34 - 2:30   Participation Level: Active  Behavioral Response: CasualAlertDepressed  Type of Therapy: Individual Therapy  Treatment Goals addressed: Improve psychiatric symptoms, elevate mood and functioning, Improve unhelpful thought patterns,   Interventions: CBT and Motivational Interviewing,psychoeducation  Summary: EMA HEBNER is a 45 y.o. female who presents with Major depressive disorder, recurrent episode, moderate and PTSD.   Suicidal/Homicidal: No - without intent/plan  Therapist Response: Rodena Piety met with clinician for an individual session. Elaiza discussed her psychiatric symptoms, her current life events, and her homework. Delbert shared that she continues to feel depressed. She shared that she was written up at work for invalid reasons. She shared she continues to be stressed at work.  Ashlye shared that she had completed her depression packet 6. Client and clinician discussed her thoughts and insight. Client and clinician discussed how to challenge unhelpful thoughts. Clinician introduced a 7 panel thought record sheet. Client and clinician completed that sheet using one of the need is negative automatic thoughts as an example. Clinician asked open ended questions and Angeli identified her trigger her emotions and rated them at her negative automatic thoughts. Nayelis then identified the evidence that did and did not support the thoughts. Chrislynn was then able to identify healthier alternative thoughts. She rated her emotions levels as less if he she were to believed the healthier alternative thoughts. Client and clinician discussed this process. Client and clinician discussed in his thoughts and insights. Rodena Piety and clinician discussed how she could use this method to continue to challenge her thoughts. Deisy agreed to complete depression packet 7.  Plan: Return again in 2-3 weeks.  Diagnosis:     Axis I: Major depressive disorder, recurrent  episode, moderate and PTSD    Evelia Waskey A, LCSW 06/09/2016

## 2016-07-08 ENCOUNTER — Ambulatory Visit: Payer: Self-pay | Admitting: Neurology

## 2016-07-13 ENCOUNTER — Ambulatory Visit: Payer: Self-pay | Admitting: Neurology

## 2016-07-21 ENCOUNTER — Ambulatory Visit: Payer: Self-pay | Admitting: Neurology

## 2016-07-28 ENCOUNTER — Ambulatory Visit: Payer: Self-pay | Admitting: Neurology

## 2016-08-17 ENCOUNTER — Ambulatory Visit (HOSPITAL_COMMUNITY): Payer: Self-pay | Admitting: Clinical

## 2016-08-24 ENCOUNTER — Ambulatory Visit: Payer: Self-pay | Admitting: Neurology

## 2016-08-31 ENCOUNTER — Ambulatory Visit (INDEPENDENT_AMBULATORY_CARE_PROVIDER_SITE_OTHER): Payer: PRIVATE HEALTH INSURANCE | Admitting: Clinical

## 2016-08-31 DIAGNOSIS — F331 Major depressive disorder, recurrent, moderate: Secondary | ICD-10-CM | POA: Diagnosis not present

## 2016-08-31 DIAGNOSIS — F4310 Post-traumatic stress disorder, unspecified: Secondary | ICD-10-CM | POA: Diagnosis not present

## 2016-08-31 NOTE — Progress Notes (Signed)
   THERAPIST PROGRESS NOTE  Session Time: 3:28 -4:28  Participation Level: Active  Behavioral Response: CasualAlertDepressed  Type of Therapy: Individual Therapy  Treatment Goals addressed: Improve psychiatric symptoms, Improve unhelpful thought patterns, Stress management  Interventions: CBT and Motivational Interviewing, grounding and mindfulness techniques  Summary: [name] is a 45 y.o. female who presents with Major depressive disorder, recurrent episode, moderate and PTSD.   Suicidal/Homicidal: No - without intent/plan  Therapist Response: Rodena Piety met with clinician for an individual session. [name] discussed her psychiatric symptoms and her current life events. [name] shared that she has continued to be depressed. She shared  That her medication had been changed in may (wellbutrin SR 150) but she feels it is not working. She shared that in addition to feeling depressed she has also been experiencing a lot of nausea. She shared it makes it difficult for her to eat especially when she doesn't work. She shared that she has a lot of physical pain in her head is hurting her neck hurts when she turns that she is not sleeping through the night. 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 Client and clinician discussed the skills that she has been practicing. Client clinician discussed stress management. Client shared that she has been using her grounding and mindfulness. Client and clinician discussed additional grounding and mindfulness techniques and how to use them all at work. Client and clinician discussed other stress reduction techniques. Client and clinician discussed the focus of her thoughts.  Plan: Return again in 2-3 weeks.  Diagnosis:     Axis I: Major depressive disorder, recurrent episode, moderate and PTSD    Marylyn Appenzeller A, LCSW 08/31/2016

## 2016-09-07 ENCOUNTER — Encounter (HOSPITAL_COMMUNITY): Payer: Self-pay | Admitting: Clinical

## 2016-10-18 ENCOUNTER — Ambulatory Visit (INDEPENDENT_AMBULATORY_CARE_PROVIDER_SITE_OTHER): Payer: PRIVATE HEALTH INSURANCE | Admitting: Clinical

## 2016-10-18 DIAGNOSIS — F331 Major depressive disorder, recurrent, moderate: Secondary | ICD-10-CM

## 2016-10-18 DIAGNOSIS — F4310 Post-traumatic stress disorder, unspecified: Secondary | ICD-10-CM | POA: Diagnosis not present
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
## 2016-10-18 NOTE — Progress Notes (Signed)
   THERAPIST PROGRESS NOTE  Session Time: 4:44 -5:33  Participation Level: Active  Behavioral Response: CasualAlertDepressed  Type of Therapy: Individual Therapy  Treatment Goals addressed: Improve psychiatric symptoms, elevate mood and functioning, Improve unhelpful thought patterns, Stress management  Interventions: CBT and Motivational Interviewing, grounding and mindfulness techniques, psychoeducation  Summary: Susan [name] is a 45 y.o. female who presents with Major depressive disorder, recurrent episode, moderate and PTSD.   Suicidal/Homicidal: No - without intent/plan  Therapist Response: Rodena Piety met with clinician for an individual session. Susan [name] discussed her psychiatric symptoms, her current life events. Client and clinician worked together to update her assessment. Susan [name] continues to be very depressed. She feels she is being harassed at work and is not satisfied with how things are handled. Client and clinician discussed Susan [name]'s interactions with others. Client and clinician discussed ways that Susan [name] could improve her experience with others. Susan [name] shared a general distrust of others due to past interactions. Client and clinician discussed levels of interactions and ways to reduce her stress   Plan: Return again in 2-3 weeks.  Diagnosis:     Axis I: Major depressive disorder, recurrent episode, moderate and PTSD   Levita Monical A, LCSW 10/18/2016

## 2016-10-25 ENCOUNTER — Encounter (HOSPITAL_COMMUNITY): Payer: Self-pay | Admitting: Clinical

## 2016-11-20 ENCOUNTER — Emergency Department (HOSPITAL_COMMUNITY)
Admission: EM | Admit: 2016-11-20 | Discharge: 2016-11-20 | Disposition: A | Discharge status: Home / Self Care | Payer: 59 | Attending: Emergency Medicine | Admitting: Emergency Medicine

## 2016-11-20 ENCOUNTER — Emergency Department (HOSPITAL_COMMUNITY): Payer: 59

## 2016-11-20 ENCOUNTER — Encounter (HOSPITAL_COMMUNITY): Payer: Self-pay | Admitting: Emergency Medicine

## 2016-11-20 DIAGNOSIS — Z7984 Long term (current) use of oral hypoglycemic drugs: Secondary | ICD-10-CM | POA: Diagnosis not present

## 2016-11-20 DIAGNOSIS — R791 Abnormal coagulation profile: Secondary | ICD-10-CM | POA: Insufficient documentation

## 2016-11-20 DIAGNOSIS — Z7982 Long term (current) use of aspirin: Secondary | ICD-10-CM | POA: Diagnosis not present

## 2016-11-20 DIAGNOSIS — Z87891 Personal history of nicotine dependence: Secondary | ICD-10-CM | POA: Diagnosis not present

## 2016-11-20 DIAGNOSIS — E119 Type 2 diabetes mellitus without complications: Secondary | ICD-10-CM | POA: Diagnosis not present

## 2016-11-20 DIAGNOSIS — G43909 Migraine, unspecified, not intractable, without status migrainosus: Secondary | ICD-10-CM | POA: Insufficient documentation

## 2016-11-20 DIAGNOSIS — R202 Paresthesia of skin: Secondary | ICD-10-CM | POA: Diagnosis present

## 2016-11-20 DIAGNOSIS — R200 Anesthesia of skin: Secondary | ICD-10-CM | POA: Insufficient documentation

## 2016-11-20 LAB — I-STAT CHEM 8, ED
BUN: 9 mg/dL (ref 6–20)
Calcium, Ion: 1.16 mmol/L (ref 1.15–1.40)
Chloride: 106 mmol/L (ref 101–111)
Creatinine, Ser: 0.7 mg/dL (ref 0.44–1.00)
GLUCOSE: 106 mg/dL — AB (ref 65–99)
HCT: 39 % (ref 36.0–46.0)
HEMOGLOBIN: 13.3 g/dL (ref 12.0–15.0)
POTASSIUM: 4 mmol/L (ref 3.5–5.1)
Sodium: 140 mmol/L (ref 135–145)
TCO2: 26 mmol/L (ref 0–100)

## 2016-11-20 LAB — COMPREHENSIVE METABOLIC PANEL
ALBUMIN: 4.5 g/dL (ref 3.5–5.0)
ALT: 18 U/L (ref 14–54)
ANION GAP: 7 (ref 5–15)
AST: 24 U/L (ref 15–41)
Alkaline Phosphatase: 69 U/L (ref 38–126)
BUN: 10 mg/dL (ref 6–20)
CHLORIDE: 105 mmol/L (ref 101–111)
CO2: 25 mmol/L (ref 22–32)
Calcium: 9.4 mg/dL (ref 8.9–10.3)
Creatinine, Ser: 0.74 mg/dL (ref 0.44–1.00)
GFR calc Af Amer: >60 mL/min (ref >60)
GFR calc non Af Amer: >60 mL/min (ref >60)
GLUCOSE: 105 mg/dL — AB (ref 65–99)
POTASSIUM: 4 mmol/L (ref 3.5–5.1)
SODIUM: 137 mmol/L (ref 135–145)
Total Bilirubin: 0.6 mg/dL (ref 0.3–1.2)
Total Protein: 8.1 g/dL (ref 6.5–8.1)

## 2016-11-20 LAB — I-STAT TROPONIN, ED: Troponin i, poc: 0 ng/mL (ref 0.00–0.08)

## 2016-11-20 LAB — CBC
HCT: 36.6 % (ref 36.0–46.0)
Hemoglobin: 11.5 g/dL — ABNORMAL LOW (ref 12.0–15.0)
MCH: 29.2 pg (ref 26.0–34.0)
MCHC: 31.4 g/dL (ref 30.0–36.0)
MCV: 92.9 fL (ref 78.0–100.0)
Platelets: 355 10*3/uL (ref 150–400)
RBC: 3.94 MIL/uL (ref 3.87–5.11)
RDW: 14.7 % (ref 11.5–15.5)
WBC: 7.2 10*3/uL (ref 4.0–10.5)

## 2016-11-20 LAB — CBG MONITORING, ED: GLUCOSE-CAPILLARY: 102 mg/dL — AB (ref 65–99)

## 2016-11-20 LAB — ETHANOL

## 2016-11-20 LAB — PROTIME-INR
INR: 1.04
PROTHROMBIN TIME: 13.6 s (ref 11.4–15.2)

## 2016-11-20 MED ORDER — BUTALBITAL-APAP-CAFFEINE 50-325-40 MG PO TABS: 1.0000 | ORAL_TABLET | Freq: Four times a day (QID) | ORAL | 0 refills | Status: DC | PRN

## 2016-11-20 MED ORDER — GADOBENATE DIMEGLUMINE 529 MG/ML IV SOLN
20.0000 mL | Freq: Once | INTRAVENOUS | Status: AC | PRN
  Administered 2016-11-20: 20 mL via INTRAVENOUS

## 2016-11-20 MED ORDER — METOCLOPRAMIDE HCL 5 MG/ML IJ SOLN
10.0000 mg | Freq: Once | INTRAMUSCULAR | Status: AC
  Administered 2016-11-20: 10 mg via INTRAVENOUS
  Filled 2016-11-20: qty 2

## 2016-11-20 MED ORDER — KETOROLAC TROMETHAMINE 15 MG/ML IJ SOLN
15.0000 mg | Freq: Once | INTRAMUSCULAR | Status: AC
  Administered 2016-11-20: 15 mg via INTRAVENOUS
  Filled 2016-11-20: qty 1

## 2016-11-20 MED ORDER — HYDROXYZINE HCL 25 MG PO TABS: 25.0000 mg | ORAL_TABLET | Freq: Three times a day (TID) | ORAL | 0 refills | Status: DC | PRN

## 2016-11-20 MED ORDER — DEXAMETHASONE SODIUM PHOSPHATE 10 MG/ML IJ SOLN
10.0000 mg | Freq: Once | INTRAMUSCULAR | Status: AC
  Administered 2016-11-20: 10 mg via INTRAVENOUS
  Filled 2016-11-20: qty 1

## 2016-11-20 MED ORDER — SODIUM CHLORIDE 0.9 % IV BOLUS (SEPSIS)
1000.0000 mL | Freq: Once | INTRAVENOUS | Status: AC
  Administered 2016-11-20: 1000 mL via INTRAVENOUS

## 2016-11-20 MED ORDER — DIPHENHYDRAMINE HCL 50 MG/ML IJ SOLN
25.0000 mg | Freq: Once | INTRAMUSCULAR | Status: AC
  Administered 2016-11-20: 25 mg via INTRAVENOUS
  Filled 2016-11-20: qty 1

## 2016-11-20 NOTE — ED Provider Notes (Addendum)
[facility]-EMERGENCY DEPT Provider Note   CSN: [id number] Arrival date & time: 11/20/16  1434     History   Chief Complaint Chief Complaint  Patient presents with  . Stroke Symptoms    HPI Susan [name] is a 46 y.o. female.  HPI  46 yo F with PMHx of anxiety, depression, T2DM who p/w stroke symptoms. According to pt and son, pt was normal until some time between 5 PM-9 PM last night. She states she Experienced acute onset of left face, arm, and leg tingling. She also noticed difficulty getting her words out. Denies any associated difficulty swallowing. No recent head trauma. No fevers or chills. Her symptoms persisted throughout the night. She called her son today who advised her to present to the ED immediately for evaluation. Currently, she does endorse a mild aching, throbbing headache. She has no history of stroke. No history of hypertension or hyperlipidemia.  Past Medical History:  Diagnosis Date  . Anemia   . Anxiety   . Arthritis   . Depression   . Headache   . Obesity   . Type II diabetes mellitus [facility])     Patient Active Problem List   Diagnosis Date Noted  . Intractable migraine without aura and without status migrainosus 11/21/2015  . Nonallopathic lesion of cervical region 04/25/2015  . Nonallopathic lesion of thoracic region 04/25/2015  . Nonallopathic lesion-rib cage 04/25/2015  . 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 Preventative health care 07/08/2014  . Food allergy 09/11/2012  . Diabetes mellitus type II, controlled (HCC) 07/31/2012  . Glucosuria 07/28/2012  . Depression 08/21/2008  . Anxiety state 02/28/2008    Past Surgical History:  Procedure Laterality Date  . TUBAL LIGATION    . wisdom tooth cyst removed      OB History    No data available       Home Medications    Prior to Admission medications   Medication Sig Start Date End Date Taking? Authorizing Provider  amitriptyline  (ELAVIL) 25 MG tablet Take 12.5 mg by mouth at bedtime.  05/28/16  Yes Historical Provider, MD  aspirin EC 81 MG tablet Take 1 tablet (81 mg total) by mouth daily. 06/09/15  Yes Doe-Hyun R Artist Pais, DO  buPROPion (WELLBUTRIN SR) 150 MG 12 hr tablet Take 150 mg by mouth daily.  11/17/16  Yes Historical Provider, MD  metFORMIN (GLUCOPHAGE) 500 MG tablet TAKE 1.5 TABLETS (750 MG TOTAL) BY MOUTH 2 (TWO) TIMES DAILY WITH A MEAL. Patient taking differently: Take 750 mg by mouth 2 (two) times daily with a meal. TAKE 1.5 TABLETS (750 MG TOTAL) BY MOUTH 2 (TWO) TIMES DAILY WITH A MEAL. 06/09/15  Yes Doe-Hyun Sherran Needs, DO  butalbital-acetaminophen-caffeine (FIORICET, ESGIC) 50-325-40 MG tablet Take 1 tablet by mouth every 6 (six) hours as needed for migraine. 11/20/16 11/20/17  Shaune Pollack, MD  hydrOXYzine (ATARAX/VISTARIL) 25 MG tablet Take 1 tablet (25 mg total) by mouth every 8 (eight) hours as needed for anxiety. 11/20/16   Shaune Pollack, MD  topiramate (TOPAMAX) 50 MG tablet Take 1 tablet (50 mg total) by mouth at bedtime. Patient not taking: Reported on 11/20/2016 03/09/16   Drema Dallas, DO  venlafaxine XR (EFFEXOR-XR) 75 MG 24 hr capsule Take 1 capsule (75 mg total) by mouth daily with breakfast. Patient not taking: Reported on 11/20/2016 03/09/16   Drema Dallas, DO  ZOLMitriptan (ZOMIG) 2.5 MG SOLN Place 1 spray into the nose as needed. Patient  not taking: Reported on 11/20/2016 03/09/16   Drema Dallas, DO  zolmitriptan (ZOMIG) 5 MG nasal solution Place 1 spray into the nose as needed for migraine. Patient not taking: Reported on 11/20/2016 01/06/16   Drema Dallas, DO    Family History Family History  Problem Relation Age of Onset  . Asthma Mother   . Hypertension Mother   . Diabetes Mother   . Heart disease Father     CABG  . Asthma Father   . Breast cancer Maternal Grandmother   . Dementia Maternal Grandmother   . Anxiety disorder Brother   . Depression Brother   . Colon cancer Neg Hx     Social  History Social History  Substance Use Topics  . Smoking status: Former Games developer  . Smokeless tobacco: Never Used  . Alcohol use No     Allergies   Cymbalta [duloxetine hcl] and Divalproex sodium   Review of Systems Review of Systems  Constitutional: Negative for chills, fatigue and fever.  HENT: Negative for congestion, rhinorrhea and sore throat.   Eyes: Negative for visual disturbance.  Respiratory: Negative for cough, shortness of breath and wheezing.   Cardiovascular: Negative for chest pain and leg swelling.  Gastrointestinal: Negative for abdominal pain, diarrhea, nausea and vomiting.  Genitourinary: Negative for dysuria, flank pain, vaginal bleeding and vaginal discharge.  Musculoskeletal: Negative for neck pain.  Skin: Negative for rash.  Allergic/Immunologic: Negative for immunocompromised state.  Neurological: Positive for weakness, numbness and headaches. Negative for syncope.  Hematological: Does not bruise/bleed easily.  All other systems reviewed and are negative.    Physical Exam Updated Vital Signs BP 122/77   Pulse 72   Temp 97.6 F (36.4 C) (Oral)   Resp 18   Ht 5\' 8"  (1.727 m)   Wt 215 lb (97.5 kg)   SpO2 100%   BMI 32.69 kg/m   Physical Exam  Constitutional: She is oriented to person, place, and time. She appears well-developed and well-nourished. No distress.  HENT:  Head: Normocephalic and atraumatic.  Eyes: Conjunctivae are normal.  Neck: Neck supple.  Cardiovascular: Normal rate, regular rhythm and normal heart sounds.  Exam reveals no friction rub.   No murmur heard. Pulmonary/Chest: Effort normal and breath sounds normal. No respiratory distress. She has no wheezes. She has no rales.  Abdominal: She exhibits no distension.  Musculoskeletal: She exhibits no edema.  Neurological: She is alert and oriented to person, place, and time. She exhibits normal muscle tone.  Skin: Skin is warm. Capillary refill takes less than 2 seconds.   Psychiatric: She has a normal mood and affect.  Nursing note and vitals reviewed.   Neurological Exam:  Mental Status: Alert and oriented to person, place, and time. Attention and concentration normal. Speech clear with mild delay on initiation, but clear once started. Recent memory is intact. Cranial Nerves: Visual fields intact to confrontation in all quadrants bilaterally. EOMI and PERRLA. No nystagmus noted. Facial sensation intact at forehead, maxillary cheek, and chin/mandible bilaterally but subjectively diminished left hemiface. No weakness of masticatory muscles. No facial asymmetry or weakness. Hearing grossly normal to finer rub. Uvula is midline, and palate elevates symmetrically. Normal SCM and trapezius strength. Tongue midline without fasciculations Motor: Muscle strength 5/5 in proximal and distal UE and LE bilaterally. No pronator drift. Muscle tone normal. Reflexes: 2+ and symmetrical in all four extremities.  Sensation: Subjectively diminished LUE and LLE diffusely, normal in RUE and RLE. Gait: Normal without ataxia. Coordination: Normal FTN  bilaterally.   ED Treatments / Results  Labs (all labs ordered are listed, but only abnormal results are displayed) Labs Reviewed  CBC - Abnormal; Notable for the following:       Result Value   Hemoglobin 11.5 (*)    All other components within normal limits  COMPREHENSIVE METABOLIC PANEL - Abnormal; Notable for the following:    Glucose, Bld 105 (*)    All other components within normal limits  CBG MONITORING, ED - Abnormal; Notable for the following:    Glucose-Capillary 102 (*)    All other components within normal limits  I-STAT CHEM 8, ED - Abnormal; Notable for the following:    Glucose, Bld 106 (*)    All other components within normal limits  ETHANOL  PROTIME-INR  I-STAT TROPOININ, ED  I-STAT CHEM 8, ED  I-STAT TROPOININ, ED    EKG  EKG Interpretation  Date/Time:  Saturday November 20 2016 14:54:46  EST Ventricular Rate:  62 PR Interval:    QRS Duration: 96 QT Interval:  430 QTC Calculation: 437 R Axis:   62 Text Interpretation:  Sinus rhythm No old tracing to compare EKG WITHIN NORMAL LIMITS Confirmed by Erma Heritage MD, Sheria Lang [phone]) on 11/20/2016 2:58:46 PM Also confirmed by Erma Heritage MD, Persephonie Hegwood [phone]), editor Stout CT, Jola Babinski [phone])  on 11/20/2016 3:05:03 PM       Radiology Ct Head Wo Contrast  Result Date: 11/20/2016 CLINICAL DATA:  Left arm weakness and numbness; slurred speech. Gait disorder. Headache. EXAM: CT HEAD WITHOUT CONTRAST TECHNIQUE: Contiguous axial images were obtained from the base of the skull through the vertex without intravenous contrast. COMPARISON:  None. FINDINGS: Brain: The ventricles are normal in size and configuration. There is invagination of CSF into the sella. There is no intracranial mass, hemorrhage, extra-axial fluid collection, or midline shift. Gray-white compartments appear normal. No acute infarct evident. Vascular: There is no hyperdense vessel. There is no vascular calcification. Skull: The bony calvarium appears intact. Sinuses/Orbits: Visualized paranasal sinuses are clear. There is mucosal edema in the nasal turbinates bilaterally. There is mild leftward deviation of the anterior nasal septum. Visualized orbits appear symmetric bilaterally. Other: Mastoid air cells are clear. IMPRESSION: Mild empty sella. No intracranial mass, hemorrhage, or extra-axial fluid collection. The gray-white compartments appear normal. Edema of the nasal turbinates noted. Electronically Signed   By: Bretta Bang III M.D.   On: 11/20/2016 15:19   Mr Laqueta Jean And Wo Contrast  Result Date: 11/20/2016 CLINICAL DATA:  Left-sided numbness.  Diabetes. EXAM: MRI HEAD WITHOUT AND WITH CONTRAST TECHNIQUE: Multiplanar, multiecho pulse sequences of the brain and surrounding structures were obtained without and with intravenous contrast. CONTRAST:  20mL MULTIHANCE GADOBENATE DIMEGLUMINE  529 MG/ML IV SOLN COMPARISON:  CT head 11/20/2016 FINDINGS: Brain: Ventricle size and cerebral volume normal. Negative for acute or chronic infarction. Negative for demyelinating disease. Normal white matter. Normal brainstem. Negative for hemorrhage or mass. Empty sella with enlargement of the sella. Pituitary not enlarged. Normal enhancement following contrast infusion. No enhancing mass lesion Vascular: Normal arterial and venous enhancement. Normal arterial flow voids. Skull and upper cervical spine: Negative Sinuses/Orbits: Mild mucosal edema in the paranasal sinuses. Normal orbit. Other: None IMPRESSION: Normal MRI of the brain with contrast Mild mucosal edema paranasal sinuses. Electronically Signed   By: Marlan Palau M.D.   On: 11/20/2016 16:54    Procedures Procedures (including critical care time)  Medications Ordered in ED Medications  metoCLOPramide (REGLAN) injection 10 mg (10 mg Intravenous Given 11/20/16  1515)  diphenhydrAMINE (BENADRYL) injection 25 mg (25 mg Intravenous Given 11/20/16 1516)  sodium chloride 0.9 % bolus 1,000 mL (0 mLs Intravenous Stopped 11/20/16 1744)  gadobenate dimeglumine (MULTIHANCE) injection 20 mL (20 mLs Intravenous Contrast Given 11/20/16 1624)  dexamethasone (DECADRON) injection 10 mg (10 mg Intravenous Given 11/20/16 1748)  ketorolac (TORADOL) 15 MG/ML injection 15 mg (15 mg Intravenous Given 11/20/16 1748)     Initial Impression / Assessment and Plan / ED Course  I have reviewed the triage vital signs and the nursing notes.  Pertinent labs & imaging results that were available during my care of the patient were reviewed by me and considered in my medical decision making (see chart for details).  Clinical Course     46 yo F with PMHx as above including anxiety, epression here with left arm, leg numbness since 9 PM last night. On arrival, VSS and WNL. Exam is as above. Pt endorsing left facial, arm, and leg numbness which does not necessarily fit with  anatomic neuro distribution, and speech deficit is variable. Must rule out CVA but additional considerations includes complex migraine, conversion d/o. Pt does admit to increased stress. Initial labs overall reassuring. CT Head neg. MRI ordered. Plan to f/u MRI, treat with migraine meds, and re-assess.  MRI of the head is negative. Otherwise, labwork is unremarkable. Symptoms are resolved after migraine medications. I discussed the case and imaging with Dr. Amada JupiterKirkpatrick of neurology. He has reviewed labs and imaging. Given resolution of symptoms, atypical distribution of symptoms, and concomitant headache, he suspects complex migraine. No indication for TIA or further workup given duration of symptoms with negative MRI according to this discussion. Will discharge with outpatient neurology follow-up.  Final Clinical Impressions(s) / ED Diagnoses   Final diagnoses:  Left sided numbness  Left facial numbness  Migraine without status migrainosus, not intractable, unspecified migraine type     Shaune Pollackameron Osker Ayoub, MD 11/20/16 1545    Shaune Pollackameron Wilian Kwong, MD 11/21/16 [phone]

## 2016-11-20 NOTE — ED Notes (Signed)
PT able to keep balance while walking to restroom

## 2016-11-20 NOTE — ED Notes (Signed)
Unable to collect labs patient is not in the room 

## 2016-11-20 NOTE — ED Triage Notes (Signed)
Per son pt took nap at 1900 yesterday and awoke with symptoms of slurred speech, trouble finding words, right arm weakness/numbness, and unsteady gait. With triage pt notable weakness/numbness to right arm, assymmetry to right lower face, and fragmented words/unable to find words. Pt denies pain. Pt denies hx of stroke.

## 2016-11-20 NOTE — ED Notes (Signed)
Patient transported to MRI 

## 2016-11-22 ENCOUNTER — Encounter (HOSPITAL_COMMUNITY): Payer: Self-pay | Admitting: Clinical

## 2016-11-22 ENCOUNTER — Ambulatory Visit (INDEPENDENT_AMBULATORY_CARE_PROVIDER_SITE_OTHER): Payer: PRIVATE HEALTH INSURANCE | Admitting: Clinical

## 2016-11-22 DIAGNOSIS — F4310 Post-traumatic stress disorder, unspecified: Secondary | ICD-10-CM

## 2016-11-22 DIAGNOSIS — F331 Major depressive disorder, recurrent, moderate: Secondary | ICD-10-CM | POA: Diagnosis not present
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
## 2016-11-24 ENCOUNTER — Encounter: Payer: Self-pay | Admitting: Neurology

## 2016-11-24 ENCOUNTER — Ambulatory Visit (INDEPENDENT_AMBULATORY_CARE_PROVIDER_SITE_OTHER): Payer: 59 | Admitting: Neurology

## 2016-11-24 VITALS — BP 126/70 | HR 79 | Ht 68.0 in | Wt 238.1 lb

## 2016-11-24 DIAGNOSIS — M542 Cervicalgia: Secondary | ICD-10-CM

## 2016-11-24 DIAGNOSIS — G43819 Other migraine, intractable, without status migrainosus: Secondary | ICD-10-CM | POA: Diagnosis not present

## 2016-11-24 DIAGNOSIS — F459 Somatoform disorder, unspecified: Secondary | ICD-10-CM | POA: Diagnosis not present

## 2016-11-24 DIAGNOSIS — G43109 Migraine with aura, not intractable, without status migrainosus: Secondary | ICD-10-CM

## 2016-11-24 MED ORDER — ZONISAMIDE 25 MG PO CAPS: 25.0000 mg | ORAL_CAPSULE | Freq: Every day | ORAL | 4 refills | Status: DC

## 2016-11-24 NOTE — Addendum Note (Signed)
Addended by: Doree BarthelLOWE, Danyeal Akens on: 11/24/2016 03:42 PM   Modules accepted: Orders
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
## 2016-11-24 NOTE — Patient Instructions (Signed)
Migraine Recommendations: 1.  Stop amitriptyline.  Start zonisamide 25mg  daily.  Call in 4 weeks with update and we can adjust dose if needed. 2.  Take iburprofen or Excedrin but limit to no more than 2 days out of the week.  Stop butalbital-acetaminophen-caffeine pills. 3.  Limit use of pain relievers to no more than 2 days out of the week.  These medications include acetaminophen, ibuprofen, triptans and narcotics.  This will help reduce risk of rebound headaches. 4.  Be aware of common food triggers such as processed sweets, processed foods with nitrites (such as deli meat, hot dogs, sausages), foods with MSG, alcohol (such as wine), chocolate, certain cheeses, certain fruits (dried fruits, some citrus fruit), vinegar, diet soda. 4.  Avoid caffeine 5.  Routine exercise 6.  Proper sleep hygiene 7.  Stay adequately hydrated with water 8.  Keep a headache diary. 9.  Maintain proper stress management. 10.  Do not skip meals. 11.  Consider supplements:  Magnesium citrate 400mg  to 600mg  daily, riboflavin 400mg , Coenzyme Q 10 100mg  three times daily 12.  Follow up in 5 months.

## 2016-12-06 ENCOUNTER — Ambulatory Visit (HOSPITAL_COMMUNITY): Payer: Self-pay | Admitting: Clinical

## 2016-12-17 ENCOUNTER — Telehealth: Payer: Self-pay | Admitting: Neurology

## 2016-12-17 NOTE — Telephone Encounter (Signed)
Her # [id number] FMLA Form

## 2016-12-17 NOTE — Telephone Encounter (Signed)
Susan [name] Nov 12, 2071. She is a patient of Dr. Moises BloodJaffe's.  She called in to check on her paperwork that was dropped off a little over a week ago. She would please like someone to call her. Thank you

## 2016-12-17 NOTE — Telephone Encounter (Signed)
Susan [name] please advise on status of paperwork.

## 2016-12-20 NOTE — Telephone Encounter (Signed)
Called patient to inform fmla form was faxed. Received confirmation. Mailed original as requested. No answer.

## 2016-12-23 ENCOUNTER — Ambulatory Visit (INDEPENDENT_AMBULATORY_CARE_PROVIDER_SITE_OTHER): Payer: PRIVATE HEALTH INSURANCE | Admitting: Clinical

## 2016-12-23 ENCOUNTER — Encounter (HOSPITAL_COMMUNITY): Payer: Self-pay | Admitting: Clinical

## 2016-12-23 DIAGNOSIS — F331 Major depressive disorder, recurrent, moderate: Secondary | ICD-10-CM

## 2016-12-23 DIAGNOSIS — F4310 Post-traumatic stress disorder, unspecified: Secondary | ICD-10-CM | POA: Diagnosis not present

## 2016-12-23 NOTE — Progress Notes (Signed)
   THERAPIST PROGRESS NOTE  Session Time: 4:20 - 5:15  Participation Level: Active  Behavioral Response: CasualAlertDepressed  Type of Therapy: Individual Therapy  Treatment Goals addressed: Improve psychiatric symptoms, elevate mood and functioning, Improve unhelpful thought patterns,   Interventions: CBT and Motivational Interviewing, grounding and mindfulness techniques, psychoeducation  Summary: Susan [name] is [name] 46 y.o. female who presents with Major depressive disorder, recurrent episode, moderate and PTSD.   Suicidal/Homicidal: No - without intent/plan  Therapist Response: Susan [name] met with clinician for an individual session. Susan [name] discussed her psychiatric symptoms and her current life events. Susan [name] shared that she continues to struggle with depression. She shared that she continues to be challenged at work. Clinician suggested that Susan [name] discuss the lack of improvement with her doctor to see if [name] medication adjustment might help. Clinician also suggested that Susan [name] meet more frequently for individual sessions. Susan [name] shared that it would be difficult due to her limited resources. Client and clinician discussed her diagnosis and her symptoms. Susan [name] stated that she has been trying to redirect her thoughts but continues to have difficulty because of work. Clinician asked open ended questions and Susan [name] shared that she continues to ruminate on things after the situation is over. Client and clinician discussed using her grounding techniques to interrupt her thoughts and then to focus on something neutral or pleasant. She agreed to try until next session. Client and clinician reviewed how to use [name] 7 panel thought record sheet. Session was ending so client and clinician agreed to go over one and complete it at [name] future session. Clinician gave Susan [name] [name] homework packet which she agreed to complete and bring back with her to next session.  Plan: Return again in 2-3 weeks.  Diagnosis:     Axis I:  Major depressive disorder, recurrent episode, moderate and PTSD    Susan Baumbach A, LCSW 12/23/2016

## 2016-12-24 ENCOUNTER — Telehealth (HOSPITAL_COMMUNITY): Payer: Self-pay | Admitting: Clinical

## 2017-01-25 ENCOUNTER — Ambulatory Visit (INDEPENDENT_AMBULATORY_CARE_PROVIDER_SITE_OTHER): Payer: PRIVATE HEALTH INSURANCE | Admitting: Clinical

## 2017-01-25 ENCOUNTER — Encounter (HOSPITAL_COMMUNITY): Payer: Self-pay | Admitting: Clinical

## 2017-01-25 DIAGNOSIS — F331 Major depressive disorder, recurrent, moderate: Secondary | ICD-10-CM | POA: Diagnosis not present

## 2017-01-25 DIAGNOSIS — F4310 Post-traumatic stress disorder, unspecified: Secondary | ICD-10-CM

## 2017-01-25 NOTE — Progress Notes (Signed)
   THERAPIST PROGRESS NOTE  Session Time: 4:32 -5:26  Participation Level: Active  Behavioral Response: CasualAlertDepressed  Type of Therapy: Individual Therapy  Treatment Goals addressed: Improve psychiatric symptoms, elevate mood and functioning, Improve unhelpful thought patterns,   Interventions: CBT and Motivational Interviewing, grounding  techniques, psychoeducation  Summary: Susan [name] is a 46 y.o. female who presents with Major depressive disorder, recurrent episode, moderate and PTSD.   Suicidal/Homicidal: No - without intent/plan  Therapist Response: Rodena Piety met with clinician for an individual session. [name] discussed her psychiatric symptoms, her current life events, and her homework. [name] shared that she continues to be very depressed. Clinician suggested that it might be a good idea to discuss with a psychiatrist her medication as it appears she could get a better benefit. This has been suggested before.  [name] shared that she continues to have difficulty at work. She shared that her manager has been focusing on her and finding little things to write her up for she shared that there is another one is also doing the same. Client and clinician discussed healthy coping skills. Client and clinician discussed what was in her power to change and what is not in her power. [name] shared that she completed her homework packet - self compassion 1 (to improve mood). Clinician asked open ended questions and [name] shared about her challenge with negative self talk. [name] shared how her past traumas have influenced her to be more sensitive about danger and criticism. Client and clinician discussed how this has affected her mood,  thoughts and actions. Clinician taught [name] a new grounding technique. Client and clinician practiced it together. [name] agreed to complete packet 2 and continue to practice her grounding and mindfulness techniques   Plan: Return again in 2-3 weeks.  Diagnosis:      Axis I: Major depressive disorder, recurrent episode, moderate and PTSD    Guillermo Nehring A, LCSW 01/25/2017

## 2017-02-08 ENCOUNTER — Ambulatory Visit (HOSPITAL_COMMUNITY): Payer: Self-pay | Admitting: Clinical

## 2017-02-10 ENCOUNTER — Encounter (HOSPITAL_COMMUNITY): Payer: Self-pay | Admitting: Clinical

## 2017-02-10 ENCOUNTER — Ambulatory Visit (INDEPENDENT_AMBULATORY_CARE_PROVIDER_SITE_OTHER): Payer: PRIVATE HEALTH INSURANCE | Admitting: Clinical

## 2017-02-10 DIAGNOSIS — F4310 Post-traumatic stress disorder, unspecified: Secondary | ICD-10-CM

## 2017-02-10 DIAGNOSIS — F331 Major depressive disorder, recurrent, moderate: Secondary | ICD-10-CM | POA: Diagnosis not present
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
## 2017-03-02 ENCOUNTER — Ambulatory Visit (INDEPENDENT_AMBULATORY_CARE_PROVIDER_SITE_OTHER): Payer: PRIVATE HEALTH INSURANCE | Admitting: Clinical

## 2017-03-02 ENCOUNTER — Encounter (HOSPITAL_COMMUNITY): Payer: Self-pay | Admitting: Clinical

## 2017-03-02 DIAGNOSIS — F4310 Post-traumatic stress disorder, unspecified: Secondary | ICD-10-CM

## 2017-03-02 DIAGNOSIS — F331 Major depressive disorder, recurrent, moderate: Secondary | ICD-10-CM | POA: Diagnosis not present
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
## 2017-03-31 ENCOUNTER — Encounter (HOSPITAL_COMMUNITY): Payer: Self-pay | Admitting: Clinical

## 2017-03-31 ENCOUNTER — Ambulatory Visit (INDEPENDENT_AMBULATORY_CARE_PROVIDER_SITE_OTHER): Payer: PRIVATE HEALTH INSURANCE | Admitting: Clinical

## 2017-03-31 DIAGNOSIS — F331 Major depressive disorder, recurrent, moderate: Secondary | ICD-10-CM

## 2017-03-31 DIAGNOSIS — F4310 Post-traumatic stress disorder, unspecified: Secondary | ICD-10-CM

## 2017-03-31 NOTE — Progress Notes (Signed)
   THERAPIST PROGRESS NOTE  Session Time: 4:33 - 5:30  Participation Level: Active  Behavioral Response: CasualAlertDepressed  Type of Therapy: Individual Therapy  Treatment Goals addressed: Improve psychiatric symptoms, elevate mood and functioning, Improve unhelpful thought patterns, Stress management  Interventions: CBT and Motivational Interviewing, grounding and mindfulness techniques, psychoeducation  Summary: Susan [name] is a 46 y.o. female who presents with Major depressive disorder, recurrent episode, moderate and PTSD.   Suicidal/Homicidal: No - without intent/plan  Therapist Response: Rodena Piety met with clinician for an individual session. [name] discussed her psychiatric symptoms, her current life events, and her homework. Robert shared that she has had some good and some challenging days since her last session. 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 Clinician asked open ended question and [name] shared about her interactions with her co- works. Client and clinician discussed ways to decrease her stress and to have more positive interactions. Client and clinician discussed her negative automatic thoughts. Client and clinician discussed how to identify, interrupt the thoughts, challenge and change them. Client and clinician reviewed grounding and mindfulness techniques. Client and clinician reviewed and discussed her homework packet ( self compassion  - to elevate mood). Clinician gave her packet 5.  Plan: Return again in 2-3 weeks.  Diagnosis:     Axis I: Major depressive disorder, recurrent episode, moderate and PTSD  Helem Reesor A, LCSW 03/31/2017

## 2017-04-26 ENCOUNTER — Ambulatory Visit: Payer: Self-pay | Admitting: Neurology

## 2017-05-06 ENCOUNTER — Ambulatory Visit: Payer: Self-pay | Admitting: Neurology

## 2017-05-06 DIAGNOSIS — Z029 Encounter for administrative examinations, unspecified: Secondary | ICD-10-CM

## 2017-05-10 ENCOUNTER — Encounter: Payer: Self-pay | Admitting: Neurology

## 2017-05-10 ENCOUNTER — Ambulatory Visit (INDEPENDENT_AMBULATORY_CARE_PROVIDER_SITE_OTHER): Payer: PRIVATE HEALTH INSURANCE | Admitting: Clinical

## 2017-05-10 ENCOUNTER — Encounter (HOSPITAL_COMMUNITY): Payer: Self-pay | Admitting: Clinical

## 2017-05-10 DIAGNOSIS — F331 Major depressive disorder, recurrent, moderate: Secondary | ICD-10-CM

## 2017-05-10 DIAGNOSIS — F4310 Post-traumatic stress disorder, unspecified: Secondary | ICD-10-CM | POA: Diagnosis not present
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
## 2017-06-16 ENCOUNTER — Ambulatory Visit: Payer: Self-pay | Admitting: Neurology

## 2017-07-11 ENCOUNTER — Telehealth (HOSPITAL_COMMUNITY): Payer: Self-pay | Admitting: Clinical

## 2017-07-12 ENCOUNTER — Encounter: Payer: Self-pay | Admitting: Neurology

## 2017-07-12 ENCOUNTER — Ambulatory Visit (INDEPENDENT_AMBULATORY_CARE_PROVIDER_SITE_OTHER): Payer: 59 | Admitting: Neurology

## 2017-07-12 VITALS — BP 110/68 | HR 86 | Ht 68.0 in | Wt 228.2 lb

## 2017-07-12 DIAGNOSIS — F329 Major depressive disorder, single episode, unspecified: Secondary | ICD-10-CM

## 2017-07-12 DIAGNOSIS — F32A Depression, unspecified: Secondary | ICD-10-CM

## 2017-07-12 DIAGNOSIS — F17200 Nicotine dependence, unspecified, uncomplicated: Secondary | ICD-10-CM | POA: Diagnosis not present

## 2017-07-12 DIAGNOSIS — G43709 Chronic migraine without aura, not intractable, without status migrainosus: Secondary | ICD-10-CM

## 2017-07-12 MED ORDER — IBUPROFEN 800 MG PO TABS: 800.0000 mg | ORAL_TABLET | Freq: Three times a day (TID) | ORAL | 2 refills | Status: DC | PRN

## 2017-07-12 MED ORDER — ZONISAMIDE 50 MG PO CAPS: ORAL_CAPSULE | ORAL | 0 refills | Status: DC

## 2017-07-12 NOTE — Progress Notes (Signed)
NEUROLOGY FOLLOW UP OFFICE NOTE  Susan [name] [id number]  HISTORY OF PRESENT ILLNESS: Susan [name] is a 46 year old right-handed woman with type II diabetes, anxiety, depression who follows up for migraine.   UPDATE: Headaches are unchanged. Intensity:  7/10 (10/10 when severe) Duration:  Constant but fluctuates (severe headaches last up to 4 days) Frequency:  daily  NSAIDS:  ibuprofen Analgesics:  no Triptans:  no Muscle relaxants: no Anti-emetic:  no Antihypertensive medications:  no Antidepressant medications:  bupropion Anticonvulsant medications:  no Vitamins/Herbal/Supplements:  no Other therapies:  No Last visit, I prescribed zonisamide.  She took 50mg  but discontinued because it was ineffective.   Caffeine:  3 days a week at most Alcohol:  no Smoker:  no Diet:  Good.  Stays hydrated. Exercise:  Walks daily Depression/stress:  Significant depression and anxiety.  Significant stress related to work.   Sleep hygiene:  Poor   HISTORY: Onset:  Has had migraines on and off for many years.  This episode recurred in June 2015, related to work-stressors. Location:  Bi-frontal and into the neck Quality:  Throbbing/aching Initial Intensity:  7/10 (10/10 when severe) Aura:  no Prodrome:  no Associated symptoms:  Nausea, photophobia, phonophobia, osmophobia, blurred vision. Initial Duration:  Constant but when severe, it lasts 3 days Initial Frequency:  Constant, but severe attacks occur 3 days per week. Triggers/exacerbating factors:  Smells of spicy foods, [place] food, [place] food, stress Relieving factors:  none Activity:  Feels that she needs to remain in bed for 4-5 days per week but forces self to get up to care for her son.  Has missed work all of February.   Past NSAIDS:  Aleve Past analgesics:  Tylenol, tramadol, Fioricet Past abortive triptans:  Maxalt, Zomig NS, sumatriptan 100mg  Past muscle relaxants:  tizanidine, Robaxin Past anti-emetic:   promethazine Past antihypertensive medications:  no Past antidepressant medications:  nortriptyline 75mg , venlafaxine XR 150mg , Cymbalta (itching), amitriptyline 12.5mg  (higher doses causes trouble waking up for work in the morning. Past anticonvulsant medications:  Topamax, gabapentin Other past therapies:  chiropractic (neck pain), dry needling (ineffective)   Family history of headache:  no  She presented to the ED on 11/20/16 for acute onset of left sided tingling of the face, arm and leg, as well as itching of both arms.  She also had difficulty getting words out.  When symptoms didn't resolve during the night, her son took her to the ED for evaluation.  She had one of her habitual headaches.  CT and MRI of the brain were unremarkable for acute stroke, bleed or mass lesion.  CBC and chem-8 were unremarkable.  It was presumed that her symptoms were secondary to migraine.  PAST MEDICAL HISTORY: Past Medical History:  Diagnosis Date  . Anemia   . Anxiety   . Arthritis   . Depression   . Headache   . Obesity   . Type II diabetes mellitus (HCC)     MEDICATIONS: Current Outpatient Prescriptions on File Prior to Visit  Medication Sig Dispense Refill  . aspirin EC 81 MG tablet Take 1 tablet (81 mg total) by mouth daily.    Marland Kitchen buPROPion (WELLBUTRIN SR) 150 MG 12 hr tablet Take 150 mg by mouth daily.     . metFORMIN (GLUCOPHAGE) 500 MG tablet TAKE 1.5 TABLETS (750 MG TOTAL) BY MOUTH 2 (TWO) TIMES DAILY WITH A MEAL. (Patient taking differently: Take 750 mg by mouth 2 (two) times daily with a meal. TAKE 1.5 TABLETS (  750 MG TOTAL) BY MOUTH 2 (TWO) TIMES DAILY WITH A MEAL.) 270 tablet 1   No current facility-administered medications on file prior to visit.     ALLERGIES: Allergies  Allergen Reactions  . Cymbalta [Duloxetine Hcl] Hives, Itching and Rash  . Divalproex Sodium Hives and Itching    FAMILY HISTORY: Family History  Problem Relation Age of Onset  . Asthma Mother   .  Hypertension Mother   . Diabetes Mother   . Heart disease Father        CABG  . Asthma Father   . Breast cancer Maternal Grandmother   . Dementia Maternal Grandmother   . Anxiety disorder Brother   . Depression Brother   . Colon cancer Neg Hx     SOCIAL HISTORY: Social History   Social History  . Marital status: Single    Spouse name: N/A  . Number of children: N/A  . Years of education: N/A   Occupational History  . Not on file.   Social History Main Topics  . Smoking status: Former Games developer  . Smokeless tobacco: Never Used  . Alcohol use No  . Drug use: No  . Sexual activity: No   Other Topics Concern  . 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    REVIEW OF SYSTEMS: Constitutional: No fevers, chills, or sweats, no generalized fatigue, change in appetite Eyes: No visual changes, double vision, eye pain Ear, nose and throat: No hearing loss, ear pain, nasal congestion, sore throat Cardiovascular: No chest pain, palpitations Respiratory:  No shortness of breath at rest or with exertion, wheezes GastrointestinaI: No nausea, vomiting, diarrhea, abdominal pain, fecal incontinence Genitourinary:  No dysuria, urinary retention or frequency Musculoskeletal:  No neck pain, back pain Integumentary: No rash, pruritus, skin lesions Neurological: as above Psychiatric: No depression, insomnia, anxiety Endocrine: No palpitations, fatigue, diaphoresis, mood swings, change in appetite, change in weight, increased thirst Hematologic/Lymphatic:  No purpura, petechiae. Allergic/Immunologic: no itchy/runny eyes, nasal congestion, recent allergic reactions, rashes  PHYSICAL EXAM: Vitals:   07/12/17 0725  BP: 110/68  Pulse: 86  SpO2: 95%   General: No acute distress.  Patient appears well-groomed.   Head:  Normocephalic/atraumatic Eyes:  Fundi examined but not visualized Neck: supple, no paraspinal tenderness, full range of  motion Heart:  Regular rate and rhythm Lungs:  Clear to auscultation bilaterally Back: No paraspinal tenderness Neurological Exam: alert and oriented to person, place, and time. Attention span and concentration intact, recent and remote memory intact, fund of knowledge intact.  Speech fluent and not dysarthric, language intact.  CN II-XII intact. Bulk and tone normal, muscle strength 5/5 throughout.  Sensation to light touch, temperature and vibration intact.  Deep tendon reflexes 2+ throughout, toes downgoing.  Finger to nose and heel to shin testing intact.  Gait normal, Romberg negative.  IMPRESSION: Chronic migraine Depression Tobacco use disorder  PLAN: 1.  We discussed over preventative options such as Botox and Aimovig.  At this point she would like to try another oral medication.  We will restart zonisamide, as I feel it was not optimized, and increase dose to 100mg  daily.  If not effective in 6 weeks, then will try one of the other options discussed (Aimovig or Botox). 2.  For abortive therapy, we will try ibuprofen 800mg .  She would like to hold off on another triptan (such as sumatriptan injection). 3.  Smoking cessation instruction/counseling given:  counseled  patient on the dangers of tobacco use, advised patient to stop smoking, and reviewed strategies to maximize success. 4.  Follow up in 3 months.  25 minutes spent face to face with patient, over 50% spent discussing management.  Shon Millet, DO  CC: Salli Real, MD

## 2017-07-12 NOTE — Patient Instructions (Addendum)
1.  We will restart zonisamide.  Take 50mg  at bedtime for one week, then increase to 100mg  at bedtime.  Contact me prior to needing refill.  If headaches not improved at all, then consider another medication (Aimovig or Botox) 2.  Take ibuprofen 800mg  every 8 hours as needed for severe headache flare up.  Limit to no more than 2 days out of the week to prevent rebound headache. 3.  Try to quit smoking.

## 2017-07-13 ENCOUNTER — Ambulatory Visit (HOSPITAL_COMMUNITY): Payer: Self-pay | Admitting: Clinical

## 2017-08-02 ENCOUNTER — Ambulatory Visit (HOSPITAL_COMMUNITY): Payer: Self-pay | Admitting: Licensed Clinical Social Worker

## 2017-08-29 ENCOUNTER — Ambulatory Visit (HOSPITAL_COMMUNITY): Payer: Self-pay | Admitting: Licensed Clinical Social Worker

## 2017-09-21 ENCOUNTER — Ambulatory Visit (HOSPITAL_COMMUNITY): Payer: Self-pay | Admitting: Licensed Clinical Social Worker

## 2017-10-20 ENCOUNTER — Ambulatory Visit: Payer: Self-pay | Admitting: Neurology

## 2017-12-16 ENCOUNTER — Ambulatory Visit: Payer: Self-pay | Admitting: Neurology

## 2018-01-05 ENCOUNTER — Ambulatory Visit: Payer: Self-pay | Admitting: Neurology

## 2018-03-31 ENCOUNTER — Ambulatory Visit: Payer: 59 | Admitting: Neurology

## 2018-03-31 ENCOUNTER — Ambulatory Visit: Payer: Self-pay | Admitting: Neurology

## 2018-03-31 ENCOUNTER — Encounter: Payer: Self-pay | Admitting: Neurology

## 2018-03-31 VITALS — BP 96/60 | HR 77 | Ht 68.0 in | Wt 238.0 lb

## 2018-03-31 DIAGNOSIS — M545 Low back pain, unspecified: Secondary | ICD-10-CM

## 2018-03-31 NOTE — Patient Instructions (Signed)
1.  I will get results of the x-rays for review 2.  In meantime, I will refer you to physical therapy for back pain. 3.  Follow up in 3 months.

## 2018-03-31 NOTE — Progress Notes (Signed)
NEUROLOGY FOLLOW UP OFFICE NOTE  Susan [name] [id number]  HISTORY OF PRESENT ILLNESS: Susan [name] is a 47 year old right-handed woman with type II diabetes, anxiety, depression, and migraines who follows up for a new problem, back pain.  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  She developed immediate left lower back pain.  Since then, she has had severe pain across her lumbar region.  There is no associated radicular pain down the leg.  Lifting, pulling, pushing, stretching and prolonged standing make it worse.  There is no position or anything that helps relieve it.  The pain is so severe that she has difficulty lifting her leg when getting out of a car.  However, she does not have actual leg weakness.  She reports some mild tingling in the back of her left thigh.  She reportedly had lumbar radiographs performed which were unremarkable.  She has been treated with opiates and muscle relaxants which are ineffective.  She has not been able to work since December.  She notes some constipation but otherwise denies bowel and bladder dysfunction.  PAST MEDICAL HISTORY: Past Medical History:  Diagnosis Date  . Anemia   . Anxiety   . Arthritis   . Depression   . Headache   . Obesity   . Type II diabetes mellitus (HCC)     MEDICATIONS: Current Outpatient Medications on File Prior to Visit  Medication Sig Dispense Refill  . aspirin EC 81 MG tablet Take 1 tablet (81 mg total) by mouth daily. (Patient not taking: Reported on 03/31/2018)    . baclofen (LIORESAL) 10 MG tablet Take 10 mg by mouth daily.    Marland Kitchen buPROPion (WELLBUTRIN SR) 150 MG 12 hr tablet Take 150 mg by mouth daily.     Marland Kitchen ibuprofen (ADVIL,MOTRIN) 800 MG tablet Take 1 tablet (800 mg total) by mouth every 8 (eight) hours as needed. (Patient not taking: Reported on 03/31/2018) 25 tablet 2  . leuprolide (LUPRON DEPOT, 60-MONTH,) 11.25 MG injection Inject 11.25 mg into the muscle every 3  (three) months.    . metFORMIN (GLUCOPHAGE) 500 MG tablet TAKE 1.5 TABLETS (750 MG TOTAL) BY MOUTH 2 (TWO) TIMES DAILY WITH A MEAL. (Patient taking differently: Take 750 mg by mouth 2 (two) times daily with a meal. TAKE 1.5 TABLETS (750 MG TOTAL) BY MOUTH 2 (TWO) TIMES DAILY WITH A MEAL.) 270 tablet 1  . zonisamide (ZONEGRAN) 50 MG capsule Take 1 capsule at bedtime for 7 days, then increase to 2 capsules at bedtime. (Patient not taking: Reported on 03/31/2018) 60 capsule 0   No current facility-administered medications on file prior to visit.     ALLERGIES: Allergies  Allergen Reactions  . Cymbalta [Duloxetine Hcl] Hives, Itching and Rash  . Divalproex Sodium Hives and Itching    FAMILY HISTORY: Family History  Problem Relation Age of Onset  . Asthma Mother   . Hypertension Mother   . Diabetes Mother   . Heart disease Father        CABG  . Asthma Father   . Breast cancer Maternal Grandmother   . Dementia Maternal Grandmother   . Anxiety disorder Brother   . Depression Brother   . Colon cancer Neg Hx     SOCIAL HISTORY: Social History   Socioeconomic History  . Marital status: Single    Spouse name: Not on file  . Number of children: Not on file  .  Years of education: Not on file  . Highest education level: Not on file  Occupational History  . Not on file  Social Needs  . Financial resource strain: Not on file  . Food insecurity:    Worry: Not on file    Inability: Not on file  . Transportation needs:    Medical: Not on file    Non-medical: Not on file  Tobacco Use  . Smoking status: Current Every Day Smoker    Packs/day: 0.25  . Smokeless tobacco: Never Used  Substance and Sexual Activity  . Alcohol use: No    Alcohol/week: 0.0 oz  . Drug use: No  . Sexual activity: Never  Lifestyle  . Physical activity:    Days per week: Not on file    Minutes per session: Not on file  . Stress: Not on file  Relationships  . Social connections:    Talks on phone: Not  on file    Gets together: Not on file    Attends religious service: Not on file    Active member of club or organization: Not on file    Attends meetings of clubs or organizations: Not on file    Relationship status: Not on file  . Intimate partner violence:    Fear of current or ex partner: Not on file    Emotionally abused: Not on file    Physically abused: Not on file    Forced sexual activity: Not on file  Other Topics Concern  . 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    REVIEW OF SYSTEMS: Constitutional: No fevers, chills, or sweats, no generalized fatigue, change in appetite Eyes: No visual changes, double vision, eye pain Ear, nose and throat: No hearing loss, ear pain, nasal congestion, sore throat Cardiovascular: No chest pain, palpitations Respiratory:  No shortness of breath at rest or with exertion, wheezes GastrointestinaI: No nausea, vomiting, diarrhea, abdominal pain, fecal incontinence Genitourinary:  No dysuria, urinary retention or frequency Musculoskeletal:  Back pain Integumentary: No rash, pruritus, skin lesions Neurological: as above Psychiatric: depression Endocrine: No palpitations, fatigue, diaphoresis, mood swings, change in appetite, change in weight, increased thirst Hematologic/Lymphatic:  No purpura, petechiae. Allergic/Immunologic: no itchy/runny eyes, nasal congestion, recent allergic reactions, rashes  PHYSICAL EXAM: Vitals:   03/31/18 0922  BP: 96/60  Pulse: 77  SpO2: 98%   General: No acute distress.  Depressed mood. Head:  Normocephalic/atraumatic Eyes:  Fundi examined but not visualized Neck: supple, no paraspinal tenderness, full range of motion Heart:  Regular rate and rhythm Lungs:  Clear to auscultation bilaterally Back: left greater than right paraspinal tenderness Neurological Exam: alert and oriented to person, place, and time. Attention span and concentration intact, recent and remote  memory intact, fund of knowledge intact.  Speech fluent and not dysarthric, language intact.  CN II-XII intact. Bulk and tone normal, muscle strength 5/5 throughout.  Sensation to pinprick reduced in left upper extremity, sensation to vibration reduced in right foot.  Deep tendon reflexes 2+ throughout, toes downgoing.  Finger to nose and heel to shin testing intact.  Gait normal, Romberg negative.  Straight leg raise test negative.  IMPRESSION: Low back strain.  She endorses some mild tingling int he posterior thigh which may suggest radicular component.    On exam, she endorses subjective numbness in the left upper and right lower extremity.  Objective exam findings are normal.  She has history of various  symptomatology with negative workup, which are suspected to be somatoform in nature.  PLAN: 1.  I will obtain results of lumbar radiographs from her PCP for review 2.  In meantime, I will refer her to physical therapy.  If ineffective, I would probably recommend referral to pain specialist. 3.  Follow up in 3 months.  Shon Millet, DO  CC: Dr. Wynelle Link

## 2018-04-06 ENCOUNTER — Ambulatory Visit: Payer: Self-pay | Admitting: Neurology

## 2018-04-06 ENCOUNTER — Encounter

## 2018-04-17 ENCOUNTER — Ambulatory Visit: Payer: 59 | Attending: Internal Medicine

## 2018-04-17 ENCOUNTER — Other Ambulatory Visit: Payer: Self-pay

## 2018-04-17 DIAGNOSIS — M6281 Muscle weakness (generalized): Secondary | ICD-10-CM | POA: Diagnosis present

## 2018-04-17 DIAGNOSIS — M62830 Muscle spasm of back: Secondary | ICD-10-CM

## 2018-04-17 DIAGNOSIS — G8929 Other chronic pain: Secondary | ICD-10-CM | POA: Diagnosis present

## 2018-04-17 DIAGNOSIS — R293 Abnormal posture: Secondary | ICD-10-CM | POA: Insufficient documentation

## 2018-04-17 DIAGNOSIS — M545 Low back pain: Secondary | ICD-10-CM | POA: Diagnosis present

## 2018-04-17 NOTE — Patient Instructions (Signed)
From cabinet LTR, single knee to chest,  3x/day 2-3 reps 10-20 sec stretching,  PPT x 10 3x/day

## 2018-04-17 NOTE — Therapy (Signed)
[facility] [address] [place], [place], [address] Phone: [phone]   Fax:  [phone]  Physical Therapy Evaluation  Patient Details  Name: Susan [name] MRN: [id number] Date of Birth: [birth year] Referring Provider: Shon MilletAdam Jaffe, DO   Encounter Date: 04/17/2018  PT End of Session - 04/17/18 1231    Visit Number  1    Number of Visits  12    Date for PT Re-Evaluation  06/02/18    Authorization Type  [employer]    PT Start Time  1227    PT Stop Time  1315    PT Time Calculation (min)  48 min    Activity Tolerance  Patient tolerated treatment well;Patient limited by pain    Behavior During Therapy  [facility] for tasks assessed/performed       Past Medical History:  Diagnosis Date  . Anemia   . Anxiety   . Arthritis   . Depression   . Headache   . Obesity   . Type II diabetes mellitus (HCC)     Past Surgical History:  Procedure Laterality Date  . TUBAL LIGATION    . wisdom tooth cyst removed      There were no vitals filed for this visit.   Subjective Assessment - 04/17/18 1239    Subjective  She report pulling cart with mail and she began to have pain in back.    She is talking medication for the pain. Able to vaccum and dishes  without incr pain    Limitations  Lifting she is not working, bending. , pushing full grocery cart,    reaching ,  assist LT leg with hand.         How long can you sit comfortably?  60 min    How long can you stand comfortably?  30 min    How long can you walk comfortably?  30 min    Diagnostic tests  Xray: negative    Patient Stated Goals  She wants to be able to return to work.     Currently in Pain?  Yes    Pain Score  8     Pain Location  Back    Pain Orientation  Lower;Left;Right    Pain Descriptors / Indicators  Aching    Pain Type  Chronic pain    Pain Onset  More than a month ago    Pain Frequency  Constant    Aggravating Factors   Bending, lifting pull /push     Pain Relieving  Factors  No benefit form meds or TENS.           [facility] - 04/17/18 0001      Assessment   Medical Diagnosis  back pain    Referring Provider  Shon MilletAdam Jaffe, DO    Onset Date/Surgical Date  -- 09/2017    Prior Therapy  No      Precautions   Precautions  None      Restrictions   Weight Bearing Restrictions  No      Balance Screen   Has the patient fallen in the past 6 months  No    Has the patient had a decrease in activity level because of a fear of falling?   No    Is the patient reluctant to leave their home because of a fear of falling?   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      Cognition   Overall Cognitive Status  Within Functional Limits for tasks assessed      Observation/Other Assessments   Focus on Therapeutic Outcomes (FOTO)   59% limited      Posture/Postural Control   Posture/Postural Control  No significant limitations    Posture Comments  incr lumbar lordosis      ROM / Strength   AROM / PROM / Strength  AROM;Strength;PROM      AROM   AROM Assessment Site  Lumbar    Lumbar Flexion  35    Lumbar Extension  20    Lumbar - Right Side Bend  10    Lumbar - Left Side Bend  10    Lumbar - Right Rotation  decr 75%    Lumbar - Left Rotation  decr 75%      PROM   Overall PROM Comments  All hip ranges tight      Strength   Overall Strength Comments  Both LE WNL , poor to fair  abdominals      Flexibility   Soft Tissue Assessment /Muscle Length  yes    Hamstrings  70 degrees bilaterally , negative    Then Thomas test + bilaterally,     Then + figure $ with stiffness ER/IR /Adduction    ITB  + bilaterally      Palpation   SI assessment   negative    Palpation comment  tender in paraspinals from mid thoracic spine to sacrum                Objective measurements completed  on examination: See above findings.              PT Education - 04/17/18 1309    Education Details  POC , HEP    Person(s) Educated  Patient    Methods  Explanation;Tactile cues;Verbal cues;Handout    Comprehension  Returned demonstration;Verbalized understanding       PT Short Term Goals - 04/17/18 1322      PT SHORT TERM GOAL #1   Title  Pt will be I with HEP initial     Time  3    Period  Weeks    Status  New      PT SHORT TERM GOAL #2   Title  Pt will report improvement in lower back pain  frequency and/or  intensity, 25% better    Time  3    Period  Weeks    Status  New      PT SHORT TERM GOAL #3   Title  Pt will be able to reach, use UEs for light activity and no increase in back pain       PT SHORT TERM GOAL #4   Title  She will demo understanding of good posture / mechanics    Period  Weeks    Status  New        PT Long Term Goals - 04/17/18 1318      PT LONG TERM GOAL #1   Title  She will be independent with all HEP issued    Time  6    Period  Weeks    Status  New      PT LONG TERM GOAL #2   Title  she will report pain decr 50%  in lower back.     Time  6    Period  Weeks    Status  New      PT LONG TERM GOAL #3   Title  she will be able to report decr 40% or more pain with pushing cart groceries     Time  6    Period  Weeks    Status  New      PT LONG TERM GOAL #4   Title  She will report bacvk pain as intermittant    Time  6    Period  Weeks    Status  New      PT LONG TERM GOAL #5   Title  FOTO score will decreased t0 <45% limited     Time  6    Period  Weeks    Status  New             Plan - 04/17/18 1231    Clinical Impression Statement  [name] presents  with chronic back pain from pulling injury at work 09/2017.  She has had no relief since that time. Medication /TENS [place] Spates /cold have done nothing to ease the pain   . She is able to sit without visible distress.  She had PT for neck issues in past without benefit.  Prognosis is guarded    History and Personal Factors relevant to plan of care:  Migraines , , chronic neck pain,  depression    Clinical Presentation  Stable    Clinical Decision Making  Low    Rehab Potential  Good    Clinical Impairments Affecting Rehab Potential  chronicity of problem. depression    PT Frequency  2x / week    PT Duration  6 weeks    PT Treatment/Interventions  Electrical Stimulation;Moist Heat;Passive range of motion;Manual techniques;Patient/family education;Therapeutic activities;Therapeutic exercise;Dry needling    PT Next Visit Plan  REveiew HEP , add if appropriate, heat, stretching and manual for ROM and pain    PT Home Exercise Plan  PPT LTR,  SKC    Consulted and Agree with Plan of Care  Patient       Patient will benefit from skilled therapeutic intervention in order to improve the following deficits and impairments:  Pain, Postural dysfunction, Decreased strength, Decreased range of motion, Decreased activity tolerance, Increased muscle spasms  Visit Diagnosis: Chronic bilateral low back pain, with sciatica presence unspecified  Muscle weakness (generalized)     Problem List Patient Active Problem List   Diagnosis Date Noted  . Intractable migraine without aura and without status migrainosus 11/21/2015  . Nonallopathic lesion of cervical region 04/25/2015  . Nonallopathic lesion of thoracic region 04/25/2015  . Nonallopathic lesion-rib cage 04/25/2015  . 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 Preventative health care 07/08/2014  . Food allergy 09/11/2012  . Diabetes mellitus type II, controlled (HCC) 07/31/2012  . Glucosuria 07/28/2012  . Depression 08/21/2008  . Anxiety state 02/28/2008    Caprice Red  PT 04/17/2018, 1:31 PM  [facility] [address] [place], [place], [address] Phone: [phone]   Fax:  [phone]  Name: Susan [name] MRN: [id number] Date of Birth: 1971-09-16

## 2018-04-28 ENCOUNTER — Encounter

## 2018-05-02 ENCOUNTER — Ambulatory Visit: Payer: 59

## 2018-05-02 DIAGNOSIS — M545 Low back pain: Principal | ICD-10-CM

## 2018-05-02 DIAGNOSIS — M6281 Muscle weakness (generalized): Secondary | ICD-10-CM

## 2018-05-02 DIAGNOSIS — R293 Abnormal posture: Secondary | ICD-10-CM

## 2018-05-02 DIAGNOSIS — G8929 Other chronic pain: Secondary | ICD-10-CM

## 2018-05-02 DIAGNOSIS — M62830 Muscle spasm of back: Secondary | ICD-10-CM

## 2018-05-02 NOTE — Therapy (Signed)
[facility] [address] [place], [place], [address] Phone: [phone]   Fax:  [phone]  Physical Therapy Treatment  Patient Details  Name: Susan [name] MRN: [id number] Date of Birth: 1971/07/29 Referring Provider: Shon Millet, DO   Encounter Date: 05/02/2018  PT End of Session - 05/02/18 1544    Visit Number  2    Number of Visits  12    Date for PT Re-Evaluation  06/02/18    Authorization Type  [employer]    PT Start Time  0345    PT Stop Time  0435    PT Time Calculation (min)  50 min    Activity Tolerance  Patient tolerated treatment well;Patient limited by pain    Behavior During Therapy  [facility] for tasks assessed/performed       Past Medical History:  Diagnosis Date  . Anemia   . Anxiety   . Arthritis   . Depression   . Headache   . Obesity   . Type II diabetes mellitus (HCC)     Past Surgical History:  Procedure Laterality Date  . TUBAL LIGATION    . wisdom tooth cyst removed      There were no vitals filed for this visit.  Subjective Assessment - 05/02/18 1545    Subjective  No better .     Currently in Pain?  Yes    Pain Score  10-Worst pain ever    Pain Location  Back    Pain Orientation  Lower;Right;Left    Pain Descriptors / Indicators  Aching    Pain Type  Chronic pain    Pain Onset  More than a month ago    Pain Frequency  Constant    Aggravating Factors   bending , lifting    Pain Relieving Factors  nothing                       [facility] Adult PT Treatment/Exercise - 05/02/18 0001      Lumbar Exercises: Stretches   Single Knee to Chest Stretch  Right;Left;2 reps;30 seconds    Lower Trunk Rotation  2 reps;30 seconds    Pelvic Tilt  10 reps;5 seconds    Pelvic Tilt Limitations  needed tactile cues to max effort.       Lumbar Exercises: Supine   Bent Knee Raise  10 reps    Bent Knee Raise Limitations  RT/LT    Bridge  15 reps    Other Supine Lumbar Exercises  clams with  green band and ball squeze x 15 each      Modalities   Modalities  Moist Heat      Moist Heat Therapy   Number Minutes Moist Heat  10 Minutes    Moist Heat Location  Lumbar Spine      Manual Therapy   Manual Therapy  Joint mobilization;Soft tissue mobilization;Taping    Joint Mobilization  Gr 3 PA glides from mid Thoracic spine to lower lumbar spine.     Soft tissue mobilization  IASTIM to paraspinals and QL  followed by prayer stretch with application of  kineseotape    Kinesiotex  Inhibit Muscle      Kinesiotix   Inhibit Muscle   A single strip over each side paraspinals                PT Education - 05/02/18 1620    Education Details  management of tape thha to remove  if irritrating and keep on until she returns and cna shower with tape.     Person(s) Educated  Patient    Methods  Explanation    Comprehension  Verbalized understanding       PT Short Term Goals - 05/02/18 1622      PT SHORT TERM GOAL #1   Title  Pt will be I with HEP initial     Baseline  abe to do intial HEP    Status  Achieved      PT SHORT TERM GOAL #2   Title  Pt will report improvement in lower back pain  frequency and/or  intensity, 25% better    Baseline  none    Status  On-going      PT SHORT TERM GOAL #3   Title  Pt will be able to reach, use UEs for light activity and no increase in back pain     Status  On-going      PT SHORT TERM GOAL #4   Title  She will demo understanding of good posture / mechanics    Status  On-going        PT Long Term Goals - 04/17/18 1318      PT LONG TERM GOAL #1   Title  She will be independent with all HEP issued    Time  6    Period  Weeks    Status  New      PT LONG TERM GOAL #2   Title  she will report pain decr 50% in lower back.     Time  6    Period  Weeks    Status  New      PT LONG TERM GOAL #3   Title  she will be able to report decr 40% or more pain with pushing cart groceries     Time  6    Period  Weeks    Status  New       PT LONG TERM GOAL #4   Title  She will report bacvk pain as intermittant    Time  6    Period  Weeks    Status  New      PT LONG TERM GOAL #5   Title  FOTO score will decreased t0 <45% limited     Time  6    Period  Weeks    Status  New            Plan - 05/02/18 1544    Clinical Impression Statement  She reports 10/10 pain but no gait deviaitons  flat affect and able to long sit on bed and lye down on back with only minor facial grimace. She wa no better or worse after manual to back.    3 more visits and assess benefit .     PT Treatment/Interventions  Electrical Stimulation;Moist Heat;Passive range of motion;Manual techniques;Patient/family education;Therapeutic activities;Therapeutic exercise;Dry needling    PT Next Visit Plan  , add to HEP cor e strength  , heat, stretching and manual for ROM and pain    PT Home Exercise Plan  PPT LTR,  SKC    Consulted and Agree with Plan of Care  Patient       Patient will benefit from skilled therapeutic intervention in order to improve the following deficits and impairments:  Pain, Postural dysfunction, Decreased strength, Decreased range of motion, Decreased activity tolerance, Increased muscle spasms  Visit Diagnosis: Chronic bilateral low back pain,  with sciatica presence unspecified  Muscle weakness (generalized)  Abnormal posture  Muscle spasm of back     Problem List Patient Active Problem List   Diagnosis Date Noted  . Intractable migraine without aura and without status migrainosus 11/21/2015  . Nonallopathic lesion of cervical region 04/25/2015  . Nonallopathic lesion of thoracic region 04/25/2015  . Nonallopathic lesion-rib cage 04/25/2015  . 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 Preventative health care 07/08/2014  . Food allergy 09/11/2012  . Diabetes mellitus type II, controlled (HCC) 07/31/2012  . Glucosuria 07/28/2012  . Depression 08/21/2008  .  Anxiety state 02/28/2008    Caprice Red  PT 05/02/2018, 4:24 PM  [facility] [address] [place] [employer] [place], [place], [address] Phone: [phone]   Fax:  [phone]  Name: Susan [name] MRN: [id number] Date of Birth: 1971-09-02

## 2018-05-04 ENCOUNTER — Ambulatory Visit: Payer: 59

## 2018-05-04 DIAGNOSIS — M6281 Muscle weakness (generalized): Secondary | ICD-10-CM

## 2018-05-04 DIAGNOSIS — M545 Low back pain: Principal | ICD-10-CM

## 2018-05-04 DIAGNOSIS — G8929 Other chronic pain: Secondary | ICD-10-CM

## 2018-05-04 DIAGNOSIS — M62830 Muscle spasm of back: Secondary | ICD-10-CM

## 2018-05-04 DIAGNOSIS — R293 Abnormal posture: Secondary | ICD-10-CM

## 2018-05-04 NOTE — Therapy (Signed)
[facility] [address] [place], [place], [address] Phone: [phone]   Fax:  [phone]  Physical Therapy Treatment  Patient Details  Name: Susan [name] MRN: [id number] Date of Birth: June 29, 1971 Referring Provider: Shon Millet, DO   Encounter Date: 05/04/2018  PT End of Session - 05/04/18 1534    Visit Number  3    Number of Visits  12    Date for PT Re-Evaluation  06/02/18    Authorization Type  [employer]ign[employer]    PT Start Time  0340    PT Stop Time  0420    PT Time Calculation (min)  40 min    Activity Tolerance  Patient tolerated treatment well;Patient limited by pain    Behavior During Therapy  [facility] for tasks assessed/performed       Past Medical History:  Diagnosis Date  . Anemia   . Anxiety   . Arthritis   . Depression   . Headache   . Obesity   . Type II diabetes mellitus (HCC)     Past Surgical History:  Procedure Laterality Date  . TUBAL LIGATION    . wisdom tooth cyst removed      There were no vitals filed for this visit.  Subjective Assessment - 05/04/18 1547    Subjective  No changes . PAin same     Pain Score  10-Worst pain ever    Pain Location  Back and neck    Pain Orientation  Right;Left;Lower    Pain Descriptors / Indicators  Aching    Pain Onset  More than a month ago    Pain Frequency  Constant                       [facility] Adult PT Treatment/Exercise - 05/04/18 0001      Lumbar Exercises: Stretches   Single Knee to Chest Stretch  Right;Left;2 reps;30 seconds    Lower Trunk Rotation  2 reps;30 seconds    Pelvic Tilt  5 seconds;20 reps    Pelvic Tilt Limitations  some better today , still poor tilt      Lumbar Exercises: Supine   Bent Knee Raise  15 reps    Bent Knee Raise Limitations  RT/LT    Bridge  15 reps    Other Supine Lumbar Exercises  clams with bluee band and ball squeze x 20 each      Manual Therapy   Joint Mobilization  Gr 3 PA glides from mid Thoracic spine  to lower lumbar spine.     Soft tissue mobilization  IASTIM to paraspinals and QL  followed by prayer stretch with application of  kineseotape               PT Short Term Goals - 05/04/18 1623      PT SHORT TERM GOAL #1   Title  Pt will be I with HEP initial     Status  Achieved      PT SHORT TERM GOAL #2   Title  Pt will report improvement in lower back pain  frequency and/or  intensity, 25% better    Status  On-going      PT SHORT TERM GOAL #3   Title  Pt will be able to reach, use UEs for light activity and no increase in back pain     Status  On-going      PT SHORT TERM GOAL #4   Title  She will demo understanding of good posture / mechanics    Baseline  demo good sitting posture    Status  Achieved        PT Long Term Goals - 04/17/18 1318      PT LONG TERM GOAL #1   Title  She will be independent with all HEP issued    Time  6    Period  Weeks    Status  New      PT LONG TERM GOAL #2   Title  she will report pain decr 50% in lower back.     Time  6    Period  Weeks    Status  New      PT LONG TERM GOAL #3   Title  she will be able to report decr 40% or more pain with pushing cart groceries     Time  6    Period  Weeks    Status  New      PT LONG TERM GOAL #4   Title  She will report bacvk pain as intermittant    Time  6    Period  Weeks    Status  New      PT LONG TERM GOAL #5   Title  FOTO score will decreased t0 <45% limited     Time  6    Period  Weeks    Status  New            Plan - 05/04/18 1621    Clinical Impression Statement  She reports feeling something is wrong in back. She reports nothing helps pain . She reports wanting to try DN for back. She was not intesested in tape or modalities reporting they did not help.  I have asked to have sessions move to PT that does DN .     PT Treatment/Interventions  Electrical Stimulation;Moist Heat;Passive range of motion;Manual techniques;Patient/family education;Therapeutic  activities;Therapeutic exercise;Dry needling    PT Next Visit Plan  , add to HEP cor e strength if pt wants , heat, stretching and manual for ROM and pain, DN     PT Home Exercise Plan  PPT LTR,  SKC    Consulted and Agree with Plan of Care  Patient       Patient will benefit from skilled therapeutic intervention in order to improve the following deficits and impairments:  Pain, Postural dysfunction, Decreased strength, Decreased range of motion, Decreased activity tolerance, Increased muscle spasms  Visit Diagnosis: Chronic bilateral low back pain, with sciatica presence unspecified  Muscle weakness (generalized)  Abnormal posture  Muscle spasm of back     Problem List Patient Active Problem List   Diagnosis Date Noted  . Intractable migraine without aura and without status migrainosus 11/21/2015  . Nonallopathic lesion of cervical region 04/25/2015  . Nonallopathic lesion of thoracic region 04/25/2015  . Nonallopathic lesion-rib cage 04/25/2015  . 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 Preventative health care 07/08/2014  . Food allergy 09/11/2012  . Diabetes mellitus type II, controlled (HCC) 07/31/2012  . Glucosuria 07/28/2012  . Depression 08/21/2008  . Anxiety state 02/28/2008    Caprice RedChasse, Aivy Akter M  PT 05/04/2018, 4:24 PM  [facility] [address] [place], [place], [address] Phone: [phone]   Fax:  [phone]  Name: Susan [name] MRN: [id number] Date of Birth: 03/20/1971

## 2018-05-09 ENCOUNTER — Ambulatory Visit: Payer: 59

## 2018-05-15 ENCOUNTER — Encounter: Payer: Self-pay | Admitting: Physical Therapy

## 2018-05-17 ENCOUNTER — Encounter: Payer: Self-pay | Admitting: Physical Therapy

## 2018-05-23 ENCOUNTER — Encounter: Payer: Self-pay | Admitting: Physical Therapy

## 2018-05-24 ENCOUNTER — Telehealth: Payer: Self-pay | Admitting: Neurology

## 2018-05-24 ENCOUNTER — Ambulatory Visit: Payer: 59 | Attending: Internal Medicine | Admitting: Physical Therapy

## 2018-05-24 ENCOUNTER — Encounter: Payer: Self-pay | Admitting: Physical Therapy

## 2018-05-24 DIAGNOSIS — R293 Abnormal posture: Secondary | ICD-10-CM | POA: Diagnosis present

## 2018-05-24 DIAGNOSIS — M62830 Muscle spasm of back: Secondary | ICD-10-CM | POA: Diagnosis present

## 2018-05-24 DIAGNOSIS — M6281 Muscle weakness (generalized): Secondary | ICD-10-CM | POA: Diagnosis present

## 2018-05-24 DIAGNOSIS — G8929 Other chronic pain: Secondary | ICD-10-CM | POA: Diagnosis present

## 2018-05-24 DIAGNOSIS — M545 Low back pain, unspecified: Secondary | ICD-10-CM

## 2018-05-24 NOTE — Telephone Encounter (Signed)
Patient came into the office today stating physical therapy is not working and Dr.Jaffe had previously told her if PT didn't work they would do more imaging. Patient wants to know if she can speak with the nurse about getting imaging scheduled.

## 2018-05-24 NOTE — Therapy (Addendum)
[place], [place], [address] Phone: [phone]   Fax:  [phone]  Physical Therapy Treatment/ Discharge   Patient Details  Name: Susan [name] MRN: [id number] Date of Birth: 01-19-1971 Referring Provider: Metta Clines, DO   Encounter Date: 05/24/2018  PT End of Session - 05/24/18 0938    Visit Number  4    Number of Visits  12    Date for PT Re-Evaluation  06/02/18    Authorization Type  [employer]    Activity Tolerance  Patient tolerated treatment well;Patient limited by pain    Behavior During Therapy  [facility] for tasks assessed/performed       Past Medical History:  Diagnosis Date  . Anemia   . Anxiety   . Arthritis   . Depression   . Headache   . Obesity   . Type II diabetes mellitus ([place])     Past Surgical History:  Procedure Laterality Date  . TUBAL LIGATION    . wisdom tooth cyst removed      There were no vitals filed for this visit.  Subjective Assessment - 05/24/18 0936    Subjective  Patient reports no change. She reports the pain is the same.  She has npot been doing much because she dosent want to aggrevate it.     Limitations  Lifting    How long can you sit comfortably?  60 min    How long can you stand comfortably?  30 min    How long can you walk comfortably?  30 min    Diagnostic tests  Xray: negative    Patient Stated Goals  She wants to be able to return to work.     Currently in Pain?  Yes    Pain Score  8     Pain Location  Back    Pain Orientation  Left    Pain Descriptors / Indicators  Aching    Pain Type  Chronic pain    Pain Onset  More than a month ago    Pain Frequency  Constant    Aggravating Factors   bending and lifting     Pain Relieving Factors  nothing     Multiple Pain Sites  No                       [facility] Adult PT Treatment/Exercise - 05/24/18 0001      Manual Therapy   Joint Mobilization  Gr 3 PA glides from mid Thoracic spine to  lower lumbar spine.     Soft tissue mobilization  IASTIM to paraspinals and QL  followed by prayer stretch with application of  kineseotape       Trigger Point Dry Needling - 05/24/18 1039    Consent Given?  Yes    Education Handout Provided  Yes    Muscles Treated Upper Body  Longissimus    Longissimus Response  -- 1 spot needled in her L3 parapsinal     Gluteus Minimus Response  -- attempted 3 spots in her glut medius but unable 2nd to pain            PT Education - 05/24/18 0937    Education Details  TPDN benefits and risks     Person(s) Educated  Patient    Methods  Explanation    Comprehension  Verbalized understanding;Returned demonstration;Verbal cues required;Tactile cues required       PT Short Term Goals -  05/24/18 1015      PT SHORT TERM GOAL #1   Title  Pt will be I with HEP initial     Baseline  abe to do intial HEP    Time  3    Period  Weeks    Status  Achieved      PT SHORT TERM GOAL #2   Title  Pt will report improvement in lower back pain  frequency and/or  intensity, 25% better    Baseline  none    Time  3    Period  Weeks    Status  On-going      PT SHORT TERM GOAL #3   Title  Pt will be able to reach, use UEs for light activity and no increase in back pain     Time  4    Period  Weeks    Status  On-going      PT SHORT TERM GOAL #4   Title  She will demo understanding of good posture / mechanics    Baseline  demo good sitting posture    Period  Weeks    Status  Achieved        PT Long Term Goals - 04/17/18 1318      PT LONG TERM GOAL #1   Title  She will be independent with all HEP issued    Time  6    Period  Weeks    Status  New      PT LONG TERM GOAL #2   Title  she will report pain decr 50% in lower back.     Time  6    Period  Weeks    Status  New      PT LONG TERM GOAL #3   Title  she will be able to report decr 40% or more pain with pushing cart groceries     Time  6    Period  Weeks    Status  New      PT LONG  TERM GOAL #4   Title  She will report bacvk pain as intermittant    Time  6    Period  Weeks    Status  New      PT LONG TERM GOAL #5   Title  FOTO score will decreased t0 <45% limited     Time  6    Period  Weeks    Status  New            Plan - 05/24/18 1000    Clinical Impression Statement  Therapy had difficulty dry needling the patient. Sh had mild spasming of her glut medius and left lumbar parapsinal on the left side but extreme tendenress to palpation. Therapy was able to perfrom needling in 1 spot to depth. Sh ereported significan pain despite the needle not being driven to the lamina as is typical with parapsinal needling. Therapy then tried to needle the patients glut medius. In three different spots she reported extreme pain after the intial drive. This is typical with a superficical skin nerves but not in three different spots. Prior to needling the patient stated she had it before for her shoulder and it didnt work. Due to abnormal respse to the initial dirve of the needle the patient is not appropriate for needling. Therapy was perfroming soft tissue mobilization when the patient state this is what her other therapist was doing and it wasnt working. Patient requested D/C. Patient strongly  encouraged to continue stretching and strengthening exercises. No goals met.     Clinical Presentation  Stable    Clinical Decision Making  Low    Rehab Potential  Good    Clinical Impairments Affecting Rehab Potential  chronicity of problem. depression    PT Frequency  2x / week    PT Duration  6 weeks    PT Treatment/Interventions  Electrical Stimulation;Moist Heat;Passive range of motion;Manual techniques;Patient/family education;Therapeutic activities;Therapeutic exercise;Dry needling    PT Next Visit Plan  , add to HEP cor e strength if pt wants , heat, stretching and manual for ROM and pain, DN     PT Home Exercise Plan  PPT LTR,  SKC    Consulted and Agree with Plan of Care   Patient       Patient will benefit from skilled therapeutic intervention in order to improve the following deficits and impairments:  Pain, Postural dysfunction, Decreased strength, Decreased range of motion, Decreased activity tolerance, Increased muscle spasms  Visit Diagnosis: Chronic bilateral low back pain, with sciatica presence unspecified  Muscle weakness (generalized)  Abnormal posture  Muscle spasm of back     Problem List Patient Active Problem List   Diagnosis Date Noted  . Intractable migraine without aura and without status migrainosus 11/21/2015  . Nonallopathic lesion of cervical region 04/25/2015  . Nonallopathic lesion of thoracic region 04/25/2015  . Nonallopathic lesion-rib cage 04/25/2015  . 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 Preventative health care 07/08/2014  . Food allergy 09/11/2012  . Diabetes mellitus type II, controlled ([place]) 07/31/2012  . Glucosuria 07/28/2012  . Depression 08/21/2008  . Anxiety state 02/28/2008   PHYSICAL THERAPY DISCHARGE SUMMARY  Visits from Start of Care: 5  Current functional level related to goals / functional outcomes: Continues to have significant pain. Patient declined further treatment    Remaining deficits: Pain  With all activity    Education / Equipment: HEP   Plan: Patient agrees to discharge.  Patient goals were not met. Patient is being discharged due to the patient's request.  ?????       Carney Living  PT DPT 05/24/2018, 10:40 AM  [facility] [address] [place], [place], [address] Phone: [phone]   Fax:  [phone]  Name: Susan [name] MRN: [id number] Date of Birth: Apr 01, 1971

## 2018-05-24 NOTE — Telephone Encounter (Signed)
We can first check an MRI of the lumbar spine to evaluate for nerve impingement as cause for her back and radicular pain.

## 2018-05-25 NOTE — Telephone Encounter (Signed)
Called Pt, LMOVM advising of MRI and to call [facility] in 3-5 days if she has not been contacted to schedule. Left contact number.

## 2018-05-26 ENCOUNTER — Ambulatory Visit: Payer: 59 | Admitting: Physical Therapy

## 2018-05-30 ENCOUNTER — Encounter: Payer: 59 | Admitting: Physical Therapy

## 2018-06-04 ENCOUNTER — Ambulatory Visit
Admission: RE | Admit: 2018-06-04 | Discharge: 2018-06-04 | Disposition: A | Discharge status: Home / Self Care | Payer: 59 | Source: Ambulatory Visit | Attending: Neurology | Admitting: Neurology

## 2018-06-04 DIAGNOSIS — M545 Low back pain, unspecified: Secondary | ICD-10-CM

## 2018-06-13 ENCOUNTER — Telehealth: Payer: Self-pay

## 2018-06-13 NOTE — Telephone Encounter (Signed)
Called and LMOVM for Pt to return my call 

## 2018-06-13 NOTE — Telephone Encounter (Signed)
Pt called LM on my VM. Called and spoke with Pt. Advised her of MRI results. Pt was adamant that this was not arthritis, that she saw the report and it showed disk problems. She stated her back did not bother her until she injured it on her job. She asked if we were trying to tell her this did not happen on her job, I advised her I was not telling her anything other than the MRI results. She said injuries can happen at any time, I advised her to follow up with her PCP.

## 2018-06-13 NOTE — Telephone Encounter (Signed)
-----   Message from Drema DallasAdam R Jaffe, DO sent at 06/13/2018 10:01 AM EDT ----- MRI of lumbar spine shows arthritic changes which may be a cause of back pain.  I would recommend following up with her PCP.

## 2018-07-06 ENCOUNTER — Telehealth: Payer: Self-pay | Admitting: Neurology

## 2018-07-06 NOTE — Telephone Encounter (Signed)
Called Pt, LMOVM advising I will try again later

## 2018-07-06 NOTE — Telephone Encounter (Signed)
Patient needs to talk to [place]. No reason given. Please call her back at [phone]. Thanks.

## 2018-07-06 NOTE — Telephone Encounter (Signed)
Called and spoke with Pt. She states she has already had P/T and it did not help her. She wants to know what to do from here.  I advised her that Dr Everlena CooperJaffe recommended she follow up with her PCP. She wanted to know [facility]. She will contact PCP

## 2018-07-20 ENCOUNTER — Telehealth: Payer: Self-pay | Admitting: Neurology

## 2018-07-20 NOTE — Telephone Encounter (Signed)
Results faxed to Dr. Wynelle Link.

## 2018-07-20 NOTE — Telephone Encounter (Signed)
Patient cancelled her appointment that was scheduled for tomorrow. She will be going to her PCP to get her MRI results. She wanted to make sure that her results were sent to her PCP. Thanks

## 2018-07-21 ENCOUNTER — Ambulatory Visit: Payer: Self-pay | Admitting: Neurology

## 2019-09-07 ENCOUNTER — Telehealth: Payer: Self-pay | Admitting: Neurology

## 2019-09-07 NOTE — Telephone Encounter (Signed)
See below Letter created for you to sign

## 2019-09-07 NOTE — Telephone Encounter (Signed)
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 She states that we have done a letter like this before and since coming off of night shifts her headaches have been doing great. She was last seen on 03-31-2018

## 2019-09-07 NOTE — Telephone Encounter (Signed)
Saw letter written from years past created [facility] to sign?

## 2019-09-12 ENCOUNTER — Telehealth: Payer: Self-pay | Admitting: Neurology

## 2019-09-12 NOTE — Telephone Encounter (Signed)
Letter place at front desk for patient to pick up. Called patient no answer left message letter at front desk.

## 2019-09-12 NOTE — Telephone Encounter (Signed)
Patient is returning a call to our office  please call  

## 2019-09-12 NOTE — Telephone Encounter (Signed)
Patient called and wants to check the status of the letter. Please call

## 2019-09-12 NOTE — Telephone Encounter (Signed)
Spoke with patient she has pick up later

## 2019-09-12 NOTE — Telephone Encounter (Signed)
Per last telephone note Letter patient requested is at front desk for pick up.

## 2019-11-19 ENCOUNTER — Telehealth: Payer: Self-pay | Admitting: Neurology

## 2019-11-19 NOTE — Telephone Encounter (Signed)
Patient called to check on the status of her form and letter she dropped off about a week ago about her time away from work. She said she needs it as soon as possible to get paid.

## 2019-11-19 NOTE — Telephone Encounter (Signed)
Please let patient know he will not feel this out, it has to come from her PCP, I will bring them to the front and she cant pick up per Him. Thanks

## 2019-11-20 NOTE — Telephone Encounter (Signed)
Patient aware and requested forms be mailed back to her. Forms mailed to patient at confirmed address.

## 2020-03-17 ENCOUNTER — Other Ambulatory Visit: Payer: Self-pay | Admitting: Orthopedic Surgery

## 2020-03-17 DIAGNOSIS — M533 Sacrococcygeal disorders, not elsewhere classified: Secondary | ICD-10-CM

## 2020-04-01 ENCOUNTER — Ambulatory Visit
Admission: RE | Admit: 2020-04-01 | Discharge: 2020-04-01 | Disposition: A | Discharge status: Home / Self Care | Payer: Worker's Compensation | Source: Ambulatory Visit | Attending: Orthopedic Surgery | Admitting: Orthopedic Surgery

## 2020-04-01 ENCOUNTER — Ambulatory Visit
Admission: RE | Admit: 2020-04-01 | Discharge: 2020-04-01 | Disposition: A | Discharge status: Home / Self Care | Payer: 59 | Source: Ambulatory Visit | Attending: Orthopedic Surgery | Admitting: Orthopedic Surgery

## 2020-04-01 ENCOUNTER — Other Ambulatory Visit: Payer: Self-pay

## 2020-04-01 DIAGNOSIS — M533 Sacrococcygeal disorders, not elsewhere classified: Secondary | ICD-10-CM

## 2020-04-22 ENCOUNTER — Telehealth: Payer: Self-pay

## 2020-04-22 NOTE — Telephone Encounter (Signed)
Returned patient's call about rescheduling [facility] appointment. Patient did not answer, left name and number for her to call back.

## 2020-04-23 ENCOUNTER — Other Ambulatory Visit: Payer: Self-pay

## 2020-04-23 DIAGNOSIS — Z1231 Encounter for screening mammogram for malignant neoplasm of breast: Secondary | ICD-10-CM

## 2020-05-08 ENCOUNTER — Ambulatory Visit: Payer: 59

## 2020-05-15 ENCOUNTER — Other Ambulatory Visit: Payer: Self-pay

## 2020-05-15 ENCOUNTER — Ambulatory Visit
Admission: RE | Admit: 2020-05-15 | Discharge: 2020-05-15 | Disposition: A | Discharge status: Home / Self Care | Payer: No Typology Code available for payment source | Source: Ambulatory Visit | Attending: Obstetrics and Gynecology | Admitting: Obstetrics and Gynecology

## 2020-05-15 ENCOUNTER — Ambulatory Visit: Payer: Self-pay | Admitting: *Deleted

## 2020-05-15 VITALS — BP 113/72 | Temp 98.0°F | Wt 227.8 lb

## 2020-05-15 DIAGNOSIS — Z01419 Encounter for gynecological examination (general) (routine) without abnormal findings: Secondary | ICD-10-CM

## 2020-05-15 DIAGNOSIS — Z1231 Encounter for screening mammogram for malignant neoplasm of breast: Secondary | ICD-10-CM

## 2020-05-15 DIAGNOSIS — Z124 Encounter for screening for malignant neoplasm of cervix: Secondary | ICD-10-CM

## 2020-05-15 NOTE — Progress Notes (Signed)
[name] is a 48 y.o. G3P0 female who presents to [facility] clinic today with no complaints.   Pap Smear: Pap smear completed today. Last Pap smear was 3 years ago at [facility] and was normal. Per patient has no history of an abnormal Pap smear. Last Pap smear result is not available in Epic.   Physical exam: Breasts Breasts symmetrical. No skin abnormalities bilateral breasts. No nipple retraction bilateral breasts. No nipple discharge bilateral breasts. No lymphadenopathy. No lumps palpated bilateral breasts.       Pelvic/Bimanual Ext Genitalia No lesions, no swelling and no discharge observed on external genitalia.        Vagina Vagina pink and normal texture. No lesions or discharge observed in vagina.        Cervix Cervix is present. Cervix pink and of normal texture. No discharge observed.    Uterus Uterus is present and palpable. Uterus in normal position and normal size.        Adnexae Bilateral ovaries present and palpable. No tenderness on palpation.         Rectovaginal No rectal exam completed today since patient had no rectal complaints. No skin abnormalities observed on exam.     Smoking History: Patient is a current smoker. She was referred to the [employer] [facility] Quit line.    Patient Navigation: Patient education provided. Access to services provided for patient through [facility] program.   Colorectal Cancer Screening: Per patient she has had a colonoscopy but is unsure of the date or the results. No complaints today.    Breast and Cervical Cancer Risk Assessment: Patient has family history of breast cancer in her maternal grandmother. No known genetic mutations, or radiation treatment to the chest before age 39. Patient does not have history of cervical dysplasia, immunocompromised, or DES exposure in-utero.  Risk Assessment    Risk Scores      05/15/2020   Last edited by: Susan [name], CMA   5-year risk: 1.1 %   Lifetime risk: 8.7 %           A: [facility] exam with pap smear No complaints today.  P: Referred patient to the [facility] for a screening mammogram. Appointment scheduled May 15, 2020 at 2:00pm.  Susan Homer, RN, FNP student 05/15/2020 2:47 PM   Attestation of Supervision of Student:  I confirm that I have verified the information documented in the nurse practitioner student's note and that I have also personally reperformed the history, physical exam and all medical decision making activities.  I have verified that all services and findings are accurately documented in this student's note; and I agree with management and plan as outlined in the documentation. I have also made any necessary editorial changes.  Susan [name], Susan Maser, RN [facility] [employer], [facility] 05/15/2020 4:08 PM

## 2020-05-15 NOTE — Patient Instructions (Addendum)
Informed Susan [name] about breast self awareness. Patient received a Pap smear today. Let her know [facility] will cover Pap smears every 5 years unless has a history of abnormal Pap smears. Referred patient to the [facility] for screening mammogram. Appointment scheduled for May 15, 2020 at 2:00pm. Patient aware of appointment and will be there. Let patient know will follow up with her within the next couple weeks with results of her Pap smear by letter or phone. Informed patient that the Breast Center will follow-up with her within the next couple of weeks with results of her mammogram by letter or phone. Susan [name] verbalized understanding.  Mila Homer, RN, FNP student 2:57 PM

## 2020-05-20 LAB — CYTOLOGY - PAP
Comment: NEGATIVE
Diagnosis: UNDETERMINED — AB
High risk HPV: POSITIVE — AB

## 2020-05-21 ENCOUNTER — Telehealth: Payer: Self-pay

## 2020-05-21 NOTE — Telephone Encounter (Signed)
Per Dr. Macon Large, patient informed Pap results, ASC-US with positive HPV, needs colposcopy. Patient verbalized understanding.

## 2020-05-21 NOTE — Telephone Encounter (Signed)
Left message on voicemail requesting patient to return call to office. (attempting to reach patient regarding lab results).

## 2020-06-16 ENCOUNTER — Ambulatory Visit: Payer: 59 | Admitting: Obstetrics & Gynecology

## 2020-06-20 ENCOUNTER — Ambulatory Visit: Payer: 59 | Admitting: Obstetrics and Gynecology

## 2020-06-23 ENCOUNTER — Telehealth: Payer: Self-pay | Admitting: Neurology

## 2020-06-23 ENCOUNTER — Ambulatory Visit: Payer: Self-pay | Admitting: Neurology

## 2020-06-23 ENCOUNTER — Encounter: Payer: Self-pay | Admitting: Neurology

## 2020-06-23 VITALS — BP 116/76 | HR 64 | Ht 68.0 in | Wt 222.0 lb

## 2020-06-23 DIAGNOSIS — E669 Obesity, unspecified: Secondary | ICD-10-CM

## 2020-06-23 DIAGNOSIS — R4020 Unspecified coma: Secondary | ICD-10-CM

## 2020-06-23 DIAGNOSIS — F325 Major depressive disorder, single episode, in full remission: Secondary | ICD-10-CM

## 2020-06-23 DIAGNOSIS — E1169 Type 2 diabetes mellitus with other specified complication: Secondary | ICD-10-CM

## 2020-06-23 DIAGNOSIS — F411 Generalized anxiety disorder: Secondary | ICD-10-CM

## 2020-06-23 NOTE — Progress Notes (Signed)
SLEEP MEDICINE CLINIC    Provider:  Melvyn Novas, MD  Primary Care Physician:  Shon Hale, MD [address] [place] [address]     Referring Provider: Deboraha Sprang  [facility]  Salli Real, Md [address] [place],  [place] [address]          Chief Complaint according to patient   Patient presents with:    . New Patient (Initial Visit)     On April 3rd,  2021-she woke up in the floor after being LOC . she had been shaking according to a witness, and she injured her right shoulder.  Episode was at about 3 PM with a friend who witnessed the spell but was unsure how long it lasted, when she came back to she realized she was in the floor. She felt for 30 minutes to get back to normal baseline. She felt confused but recognized her surroundings.  Her jacket had been taken off and EMS had been called, but she did not want to go with them(?).  Blood glucose was checked, BP , too- all normal. No tax screen.  She has had spells that feel as if she may faint once a month since.  She is having speech trouble during that time. No medication started no imaging.       HISTORY OF PRESENT ILLNESS:  Susan [name] is a 49  year old African- American female patient and seen here upon a referral from Levada Dy on 06/23/2020 Chief concern according to patient :   I have seen Dr Everlena Cooper , who has not been willing to work me in- I am now here to see neurology.  On April 3rd,  2021-she woke up in the floor after being LOC . she had been shaking according to a witness, and she injured her right shoulder.  Episode was at about 3 PM with a friend who witnessed the spell but was unsure how long it lasted, when she came back to she realized she was in the floor. She felt for 30 minutes to get back to normal baseline. She felt confused but recognized her surroundings.  Her jacket had been taken off and EMS had been called, but she did not want to go with them(?).   Blood glucose was checked, BP , too- all normal. No tax screen.  She has had spells that feel as if she may faint once a month since.  She is having speech trouble during that time. No medication started no imaging.      I have the pleasure of seeing Susan [name] today, a right -handed African American female with a possible neurological  disorder.  She   has a past medical history of Anemia, Anxiety, Depression, Headache, Obesity, and Type II diabetes mellitus (HCC). and repeated spells of near fainting/ near syncope, one spell of possible seizures. .    seizure relevant medical history: No TBI, no head injuries, no childhood epilepsy, no family history of epilepsy.    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   She  lives in a household with her adult son. The patient used to work in shifts( Chief Technology Officer,) Pets are not  present. Tobacco use- 4-5/ day.  ETOH use; rare ,  Caffeine intake in form of Coffee( 1 a day ) Soda( /) Tea ( /) or energy drinks. Regular exercise in form  of walking.   She reports getting 6-8 hours of sleep at night, bedtime from 12 midnight  To about 7 AM.  No nightmares. Has a history of Insomnia when she worked night shifts.   her life is stress full :Job, financial,    Review of Systems: Out of a complete 14 system review, the patient complains of only the following symptoms, and all other reviewed systems are negative.:  Fatigue, sleepiness , aphasia, spells , one with LOC>     Total = 6/ 24 points   FSS endorsed at 30/ 63 points.   Social History   Socioeconomic History  . Marital status: Single    Spouse name: Not on file  . Number of children: Not on file  . Years of education: Not on file  . Highest education level: Some college, no degree  Occupational History  . Not on file  Tobacco Use  . Smoking status: Current Every Day Smoker    Packs/day: 0.25  . Smokeless tobacco: Never Used  Vaping Use   . Vaping Use: Never used  Substance and Sexual Activity  . Alcohol use: Yes    Alcohol/week: 0.0 standard drinks    Comment: occ  . Drug use: No  . Sexual activity: Never  Other Topics Concern  . Not on file  Social History Narrative   Works at post office   61 year old son   Social Determinants of Corporate investment banker Strain:   . Difficulty of Paying Living Expenses:   Food Insecurity:   . Worried About Programme researcher, broadcasting/film/video in the Last Year:   . Barista in the Last Year:   Transportation Needs: No Transportation Needs  . Lack of Transportation (Medical): No  . Lack of Transportation (Non-Medical): No  Physical Activity:   . Days of Exercise per Week:   . Minutes of Exercise per Session:   Stress:   . Feeling of Stress :   Social Connections:   . Frequency of Communication with Friends and Family:   . Frequency of Social Gatherings with Friends and Family:   . Attends Religious Services:   . Active Member of Clubs or Organizations:   . Attends Banker Meetings:   Marland Kitchen Marital Status:     Family History  Problem Relation Age of Onset  . Asthma Mother   . Hypertension Mother   . Diabetes Mother   . Heart disease Father        CABG  . Asthma Father   . Breast cancer Maternal Grandmother   . Dementia Maternal Grandmother   . Anxiety disorder Brother   . Depression Brother   . Colon cancer Neg Hx     Past Medical History:  Diagnosis Date  . Anemia   . Anxiety   . Depression   . Headache   . Obesity   . Type II diabetes mellitus (HCC)     Past Surgical History:  Procedure Laterality Date  . TUBAL LIGATION    . wisdom tooth cyst removed       Current Outpatient Medications on File Prior to Visit  Medication Sig Dispense Refill  . aspirin EC 81 MG tablet Take 1 tablet (81 mg total) by mouth daily.    . metFORMIN (GLUCOPHAGE) 500 MG tablet TAKE 1.5 TABLETS (750 MG TOTAL) BY MOUTH 2 (TWO) TIMES DAILY WITH A MEAL. (Patient taking  differently: Take 500 mg by mouth 2 (two) times daily with  a meal. ) 270 tablet 1   No current facility-administered medications on file prior to visit.    Allergies  Allergen Reactions  . Cymbalta [Duloxetine Hcl] Hives, Itching and Rash  . Divalproex Sodium Hives and Itching    Physical exam:  Today's Vitals   06/23/20 1325  BP: 116/76  Pulse: 64  Weight: 222 lb (100.7 kg)  Height: 5\' 8"  (1.727 m)   Body mass index is 33.75 kg/m.   Wt Readings from Last 3 Encounters:  06/23/20 222 lb (100.7 kg)  05/15/20 227 lb 12.8 oz (103.3 kg)  03/31/18 238 lb (108 kg)     Ht Readings from Last 3 Encounters:  06/23/20 5\' 8"  (1.727 m)  03/31/18 5\' 8"  (1.727 m)  07/12/17 5\' 8"  (1.727 m)      General: The patient is awake, alert and appears not in acute distress. The patient is well groomed. Head: Normocephalic, atraumatic. Neck is supple. Mallampati 2 plus  , pale-  neck circumference: 15 inches .  Nasal airflow  patent.  Retrognathia is seen.  Dental status: intact.  Cardiovascular:  Regular rate and cardiac rhythm by pulse,  without distended neck veins. Respiratory: Lungs are clear to auscultation.  Skin:  Without evidence of ankle edema, or rash. Trunk: The patient's posture is relaxed.    Neurologic exam : The patient is awake and alert, oriented to place and time.   Memory subjective described as intact.  Attention span & concentration ability appears normal.  Speech is fluent,  without dysarthria, dysphonia or aphasia.  Mood and affect are appropriate.   Cranial nerves: no loss of smell or taste reported  Pupils are equal and briskly reactive to light. Funduscopic exam without palor or scar.   Extraocular movements in vertical and horizontal planes were intact and without nystagmus. No Diplopia. Visual fields by finger perimetry are intact. Hearing was intact to soft voice and finger rubbing.    Facial sensation intact to fine touch.  Facial motor strength is  symmetric and tongue and uvula move midline.  Neck ROM : rotation, tilt and flexion extension were normal for age and shoulder shrug was symmetrical.    Motor exam:  Symmetric bulk, tone and ROM.   Normal tone without cog wheeling, symmetric grip strength .   Sensory:  Fine touch and vibration were normal.  Proprioception tested in the upper extremities was normal.   Coordination: Rapid alternating movements in the fingers/hands were of normal speed.  The Finger-to-nose maneuver was intact without evidence of ataxia, dysmetria or tremor.   Gait and station: Patient could rise unassisted from a seated position, walked without assistive device.  Stance is of normal width/ base and the patient turned with 3 steps.  Toe and heel walk were deferred.  Deep tendon reflexes: in the  upper and lower extremities are symmetric and intact.  Babinski response was deferred .     On April 3rd,  2021-she woke up in the floor after being LOC . she had been shaking according to a witness, and she injured her right shoulder.  Episode was at about 3 PM with a friend who witnessed the spell but was unsure how long it lasted, when she came back to she realized she was in the floor. She felt for 30 minutes to get back to normal baseline. She felt confused but recognized her surroundings.  Her jacket had been taken off and EMS had been called, but she did not want to go with them(?).  Blood glucose was checked, BP , too- all normal. No tax screen.  She has had spells that feel as if she may faint once a month since.  She is having speech trouble during that time. No medication started no imaging.      After spending a total time of  50 minutes face to face and additional time for physical and neurologic examination, review of laboratory studies,  personal review of imaging studies, reports and results of other testing and review of referral information / records as far as provided in visit, I have established the  following assessments:  1)  There is a reported serial pattern of ' spells' , only one was associated with LOC.  History is spotty.  2)  We will need an MRI image for brain and order EEG.  3)  The patient will need a CMET. She had recently been seen at [facility]- 05-15-2020 we can use those.   My Plan is to proceed with:  1) MRI brain , with and without.  2)  EEG .   I would like to thank Shon Hale, MD and Salli Real, Md [address] [place],  [place] [address] for allowing me to meet with and to take care of this pleasant patient.   In short, Susan [name] is a patient with a possible LOC, syncope and needs to be evaluated for seizures.    I plan to follow up  through our NP within 2-3 month.   CC: I will share my notes with PCP.  Electronically signed by: Melvyn Novas, MD 06/23/2020 1:45 PM  [facility] and [facility] Sleep Board certified by The [employer] of Sleep Medicine and Diplomate of the [employer] of Sleep Medicine. Board certified In Neurology through the ABPN, Fellow of the [employer] of Neurology. Medical Director of [employer].

## 2020-06-23 NOTE — Patient Instructions (Signed)
Near-Syncope Near-syncope is when you suddenly get weak or dizzy, or you feel like you might pass out (faint). This may also be called presyncope. This is due to a lack of blood flow to the brain. During an episode of near-syncope, you may:  Feel dizzy, weak, or light-headed.  Feel sick to your stomach (nauseous).  See all white or all black.  See spots.  Have cold, clammy skin. This condition is caused by a sudden decrease in blood flow to the brain. This decrease can result from various causes, but most of those causes are not dangerous. However, near-syncope may be a sign of a serious medical problem, so it is important to seek medical care. Follow these instructions at home: Medicines  Take over-the-counter and prescription medicines only as told by your doctor.  If you are taking blood pressure or heart medicine, get up slowly and spend many minutes getting ready to sit and then stand. This can help with dizziness. General instructions  Be aware of any changes in your symptoms.  Talk with your doctor about your symptoms. You may need to have testing to find the cause of your near-syncope.  If you start to feel like you might pass out, lie down right away. Raise (elevate) your feet above the level of your heart. Breathe deeply and steadily. Wait until all of the symptoms are gone.  Have someone stay with you until you feel stable.  Do not drive, use machinery, or play sports until your doctor says it is okay.  Drink enough fluid to keep your pee (urine) pale yellow.  Keep all follow-up visits as told by your doctor. This is important. Get help right away if you:  Have a seizure.  Have pain in your: ? Chest. ? Belly (abdomen). ? Back.  Faint once or more than once.  Have a very bad headache.  Are bleeding from your mouth or butt.  Have black or tarry poop (stool).  Have a very fast or uneven heartbeat (palpitations).  Are mixed up (confused).  Have trouble  walking.  Are very weak.  Have trouble seeing. These symptoms may be an emergency. Do not wait to see if the symptoms will go away. Get medical help right away. Call your local emergency services (911 in the [place] Do not drive yourself to the hospital. Summary  Near-syncope is when you suddenly get weak or dizzy, or you feel like you might pass out (faint).  This condition is caused by a lack of blood flow to the brain.  Near-syncope may be a sign of a serious medical problem, so it is important to seek medical care. This information is not intended to replace advice given to you by your health care provider. Make sure you discuss any questions you have with your health care provider. Document Revised: 02/23/2019 Document Reviewed: 09/20/2018 Elsevier Patient Education  2020 Elsevier Inc.  

## 2020-06-23 NOTE — Telephone Encounter (Signed)
Called the patient. Her apt was made for today at 1:30 pm. Dr Vickey Huger has reviewed the patient's chart and since the patient has been established with [facility] neurology she is wanting the patient to follow up with [facility]. I called to discuss with the patient but there was no answer. A VM was left for the patient to call back.  **When the patient calls back please advise of the above information. The referral says nothing about being a second opinion and this appears to be for a new problem or concern that she is having. If that is the case the patient must follow up with her established neurologist at .

## 2020-06-26 ENCOUNTER — Other Ambulatory Visit: Payer: Self-pay

## 2020-06-26 ENCOUNTER — Telehealth: Payer: Self-pay | Admitting: Neurology

## 2020-06-26 ENCOUNTER — Encounter: Payer: Self-pay | Admitting: Family Medicine

## 2020-06-26 ENCOUNTER — Other Ambulatory Visit (HOSPITAL_COMMUNITY)
Admission: RE | Admit: 2020-06-26 | Discharge: 2020-06-26 | Disposition: A | Discharge status: Home / Self Care | Payer: No Typology Code available for payment source | Source: Ambulatory Visit | Attending: Obstetrics & Gynecology | Admitting: Obstetrics & Gynecology

## 2020-06-26 ENCOUNTER — Ambulatory Visit (INDEPENDENT_AMBULATORY_CARE_PROVIDER_SITE_OTHER): Payer: Self-pay | Admitting: Family Medicine

## 2020-06-26 VITALS — BP 122/64 | HR 68 | Ht 68.0 in | Wt 224.0 lb

## 2020-06-26 DIAGNOSIS — Z3202 Encounter for pregnancy test, result negative: Secondary | ICD-10-CM

## 2020-06-26 DIAGNOSIS — R87612 Low grade squamous intraepithelial lesion on cytologic smear of cervix (LGSIL): Secondary | ICD-10-CM

## 2020-06-26 DIAGNOSIS — R87810 Cervical high risk human papillomavirus (HPV) DNA test positive: Secondary | ICD-10-CM

## 2020-06-26 DIAGNOSIS — R87610 Atypical squamous cells of undetermined significance on cytologic smear of cervix (ASC-US): Secondary | ICD-10-CM

## 2020-06-26 DIAGNOSIS — N871 Moderate cervical dysplasia: Secondary | ICD-10-CM

## 2020-06-26 LAB — POCT PREGNANCY, URINE: Preg Test, Ur: NEGATIVE

## 2020-06-26 NOTE — Telephone Encounter (Signed)
I left a voicemail for Susan [name] to give me a call about setting up this patient MRI. The patient stated it needs to go through [facility].

## 2020-06-26 NOTE — Telephone Encounter (Signed)
This is FYI, pt has called asking the EEG be cancelled because it can only be done thru [facility] and [employer].  Pt advised the EEG has been cancelled.  No call back requested

## 2020-06-26 NOTE — Progress Notes (Signed)
    GYNECOLOGY CLINIC COLPOSCOPY PROCEDURE NOTE  49 y.o. G3P0 here for colposcopy for ASCUS with POSITIVE high risk HPV pap smear on 05/15/2020. Discussed role for HPV in cervical dysplasia, need for surveillance.  Patient given informed consent, signed copy in the chart, time out was performed.  Placed in lithotomy position. Cervix viewed with speculum and colposcope after application of acetic acid.   Physical Exam Genitourinary:    Vagina: Normal.     Cervix: No friability or cervical bleeding.        Colposcopy adequate? Yes  visible lesion(s) at 3 o'clock; corresponding biopsies obtained.  ECC specimen obtained. All specimens were labeled and sent to pathology.  Patient was given post procedure instructions.  Will follow up pathology and manage accordingly; patient will be contacted with results and recommendations.  Routine preventative health maintenance measures emphasized.   Federico Flake, MD, MPH, ABFM, [facility] Attending Physician Center for [facility]

## 2020-06-26 NOTE — Telephone Encounter (Signed)
[facility]eth Cornfield with [facility] called me and informed me that she only get one slot a month for a MRI and she is already used her slots. Her next available slot would not be until Oct. She stated that she has explained to the patient she would have to be self pay if she got it sooner than that. Judeth Cornfield informed me she will be out of the office until next Wednesday but she would work on it and see what she could do. Judeth Cornfield ph # [phone] fax # [phone].

## 2020-06-30 ENCOUNTER — Telehealth: Payer: Self-pay

## 2020-06-30 ENCOUNTER — Encounter: Payer: Self-pay | Admitting: Family Medicine

## 2020-06-30 DIAGNOSIS — N870 Mild cervical dysplasia: Secondary | ICD-10-CM | POA: Insufficient documentation

## 2020-06-30 DIAGNOSIS — Z712 Person consulting for explanation of examination or test findings: Secondary | ICD-10-CM

## 2020-06-30 LAB — SURGICAL PATHOLOGY

## 2020-06-30 NOTE — Telephone Encounter (Addendum)
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 Recommend follow up pap with cotesting in 1 year  Notified pt results and provider recommendation.  Pt verbalized understanding with no further questions.  Addison Naegeli, RN  06/30/20

## 2020-07-09 ENCOUNTER — Ambulatory Visit: Payer: 59 | Admitting: Neurology

## 2020-07-16 ENCOUNTER — Other Ambulatory Visit: Payer: Self-pay

## 2020-07-23 ENCOUNTER — Other Ambulatory Visit: Payer: Self-pay

## 2020-07-31 NOTE — Telephone Encounter (Signed)
Susan [name] the [facility] left me a message on my phone informing me that the patient is scheduled with [facility] for Thursday 08/14/20 at 10:30 AM. Patient is also aware of this.

## 2020-08-18 ENCOUNTER — Telehealth: Payer: Self-pay | Admitting: Neurology

## 2020-08-18 NOTE — Telephone Encounter (Signed)
Pt had completed the MRI of brain through [facility]. The report has been forwarded for [place] to review. Will provide Dr Dohmeier with the result and once she has reviewed,I will contact the patient with result after discussing with Dr Dohmeier.

## 2020-08-19 ENCOUNTER — Encounter: Payer: Self-pay | Admitting: Neurology

## 2020-08-19 NOTE — Telephone Encounter (Signed)
  The MRI was read by [facility] Radiologist Dr Lanna Poche.  This MRI was performed with and without contrast evaluation for possible seizure or loss of consciousness spell.  Findings this was a normal MRI extracranial soft tissue was normal bone marrow signal of the skull was normal there was no bleed, no tumor, no fluid retention and no evidence of any stroke or abnormal inflammation infection or contrast enhancement otherwise.  Both mesial temporal lobe structures were normal and symmetric which is important in a seizure work-up.  Conclusion this was a normal MRI of the brain with and without contrast.  It was signed by Dr. Michele Mcalpine on 08-14-2020.    Susan Novas, MD   PS:  The images are not available to me on Epic /through the [facility].

## 2020-08-20 NOTE — Telephone Encounter (Signed)
Called the patient to advise of the normal MRI findings. Pt verbalized understanding. Pt had no questions at this time but was encouraged to call back if questions arise.

## 2020-09-09 ENCOUNTER — Telehealth: Payer: Self-pay | Admitting: Neurology

## 2020-09-09 NOTE — Telephone Encounter (Signed)
Pt called wanting to know if her EEG can be scheduled at [facility]. Pt states that she is trying to get financial assistance and it does not cover our office. Please advise.

## 2020-09-10 ENCOUNTER — Other Ambulatory Visit: Payer: Self-pay

## 2020-09-10 NOTE — Telephone Encounter (Signed)
Does patient just need a regular EEG ?

## 2020-09-10 NOTE — Telephone Encounter (Signed)
Yes, thank you for your help!

## 2020-09-15 NOTE — Telephone Encounter (Signed)
Noted Referral was sent

## 2020-09-16 ENCOUNTER — Ambulatory Visit: Payer: Self-pay | Admitting: Family Medicine

## 2020-09-22 NOTE — Telephone Encounter (Signed)
Spoke to Patient x 2 and relayed informant sent to Dr. Everlena Cooper Telephone [phone] . Patient can see anyone for her EEG

## 2020-10-13 ENCOUNTER — Telehealth: Payer: Self-pay | Admitting: Neurology

## 2020-10-13 NOTE — Telephone Encounter (Signed)
Pt called wanting to know when she will be called with her EEG results. Please advise. °

## 2020-10-15 NOTE — Telephone Encounter (Signed)
**  Called the patient to advise that the EEG was completed and reviewed. There was no answer, LVM advising the patient that EEG completed appeared normal and did not show signs of seizure like activity.  Instructed the patient to call with any questions.   EEG RESULTS From [facility] "STATE: Awake and asleep PROCEDURE: A routine EEG with 21 recording channels and a single channel devoted to limited EKG recording was performed. Electrodes were placed using the standard International 10-20 System. Upon maximal arousal, the background consists of a 10 Hz frequency, 30-35 uV amplitude rhythm which is symmetric over the posterior derivations and attenuates appropriately with eye opening. There is no change in the background rhythm with photic stimulation or hyperventilation. Drowsiness noted. Sleep evidenced by presence of vertex waves, sleep spindles, K complexes. No focal abnormalities, asymmetries or epileptiform discharges are observed during this recording. INTERPRETATION: Normal awake and asleep EEG. CLINICAL CORRELATION: A normal EEG does not rule out the possibility of underlying seizures or seizure disorder."

## 2020-12-23 ENCOUNTER — Telehealth: Payer: Self-pay | Admitting: Family Medicine

## 2020-12-23 ENCOUNTER — Telehealth: Payer: Self-pay | Admitting: Neurology

## 2020-12-23 NOTE — Telephone Encounter (Signed)
Elwin Mocha [place] sent me a call on out going EEG to [facility] looking back at Notes she was referred there . I dont have anything on her let me know if I can help thanks Annabelle Harman

## 2020-12-23 NOTE — Telephone Encounter (Signed)
Hello, this pt is calling and states that she received a bill from last year when she had her Colpo and was charged for a preg test.  Pt was under BCCCP program.  I advised pt that it was standard protocol before the procedure and pt states that she was not sexually active and had tubal. I explained again that it was standard procedure so I am not sure but I can forward info for someone to speak with you.  Billing told her to call the office to dispute this charge. Patient wants someone to call her back to dispute this charge for $ 22.00. pt phone# is [phone].

## 2020-12-24 NOTE — Telephone Encounter (Signed)
Called the patient to clarify what exactly her phone call was in reference to. Patient had just called to get a referral to the [facility] neurology team. The phone staff had advised the patient to contact her primary care for that referral. She had no other questions and no other concerns. Informed the patient to have the neurologist office that she goes to request any medical information if necessary. Patient verbalized understanding and had no further questions.

## 2020-12-24 NOTE — Telephone Encounter (Signed)
Pt has already completed the EEG through the [facility] system in Nov of 2021. I called her with the results after it was completed. There were no other orders entered for the patient to have any further EEG testing. Pt just needs to schedule a follow up appointment visit.

## 2021-01-21 DIAGNOSIS — N946 Dysmenorrhea, unspecified: Secondary | ICD-10-CM | POA: Insufficient documentation

## 2021-03-18 DIAGNOSIS — M545 Low back pain, unspecified: Secondary | ICD-10-CM | POA: Insufficient documentation

## 2021-05-06 DIAGNOSIS — M5430 Sciatica, unspecified side: Secondary | ICD-10-CM | POA: Insufficient documentation

## 2021-06-04 ENCOUNTER — Other Ambulatory Visit: Payer: Self-pay

## 2021-06-04 DIAGNOSIS — Z1231 Encounter for screening mammogram for malignant neoplasm of breast: Secondary | ICD-10-CM

## 2021-07-13 ENCOUNTER — Other Ambulatory Visit: Payer: Self-pay | Admitting: Obstetrics and Gynecology

## 2021-07-13 DIAGNOSIS — Z1231 Encounter for screening mammogram for malignant neoplasm of breast: Secondary | ICD-10-CM

## 2021-07-14 ENCOUNTER — Ambulatory Visit
Admission: RE | Admit: 2021-07-14 | Discharge: 2021-07-14 | Disposition: A | Discharge status: Home / Self Care | Payer: No Typology Code available for payment source | Source: Ambulatory Visit | Attending: Family Medicine | Admitting: Family Medicine

## 2021-07-14 ENCOUNTER — Other Ambulatory Visit: Payer: Self-pay

## 2021-07-14 ENCOUNTER — Ambulatory Visit: Payer: Self-pay | Admitting: *Deleted

## 2021-07-14 VITALS — BP 108/70 | Wt 243.4 lb

## 2021-07-14 DIAGNOSIS — Z1239 Encounter for other screening for malignant neoplasm of breast: Secondary | ICD-10-CM

## 2021-07-14 NOTE — Progress Notes (Signed)
[name] is a 50 y.o. G3P0 female who presents to [facility] clinic today with no complaints.    Pap Smear: Pap smear completed today. Last Pap smear was 05/15/2020 at [facility] clinic and was abnormal - ASCUS with positive HPV . 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 Per patient her last Pap smear is the only abnormal Pap smear she has had. Last Pap smear and colposcopy result is available in Epic.   Physical exam: Breasts Breasts symmetrical. No skin abnormalities bilateral breasts. No nipple retraction bilateral breasts. No nipple discharge bilateral breasts. No lymphadenopathy. No lumps palpated bilateral breasts. No complaints of pain or tenderness on exam.  MS DIGITAL SCREENING TOMO BILATERAL  Result Date: 05/18/2020 CLINICAL DATA:  Screening. EXAM: DIGITAL SCREENING BILATERAL MAMMOGRAM WITH TOMO AND CAD COMPARISON:  Previous exam(s). ACR Breast Density Category d: The breast tissue is extremely dense, which lowers the sensitivity of mammography FINDINGS: There are no findings suspicious for malignancy. Images were processed with CAD. IMPRESSION: No mammographic evidence of malignancy. A result letter of this screening mammogram will be mailed directly to the patient. RECOMMENDATION: Screening mammogram in one year. (Code:SM-B-01Y) BI-RADS CATEGORY  1: Negative. Electronically Signed   By: Sherian Rein M.D.   On: 05/18/2020 10:30        Pelvic/Bimanual Ext Genitalia No lesions, no swelling and no discharge observed on external genitalia.        Vagina Vagina pink and normal texture. No lesions or discharge observed in vagina.        Cervix Cervix is present. Cervix pink and of normal texture. No discharge observed.    Uterus Uterus is present and palpable. Uterus in normal position and normal size.        Adnexae Bilateral ovaries present and palpable. No tenderness on palpation.         Rectovaginal No rectal exam completed  today since patient had no rectal complaints. No skin abnormalities observed on exam.     Smoking History: Patient is a current smoker. Discussed smoking cessation with patient. Referred to the [facility] Quitline and gave resources to the free smoking cessation classes at [facility].   Patient Navigation: Patient education provided. Access to services provided for patient through [facility] program. Patient has food insecurities. Patient escorted to the food market at the [facility] for groceries.  Colorectal Cancer Screening: Per patient had a colonoscopy completed around 3-4 years ago at [facility]. No complaints today.    Breast and Cervical Cancer Risk Assessment: Patient has family history of breast cancer in her maternal grandmother. No known genetic mutations, or radiation treatment to the chest before age 74. Patient has history of cervical dysplasia. Patient has no history of being immunocompromised or DES exposure in-utero.  Risk Assessment     Risk Scores       07/14/2021 05/15/2020   Last edited by: Narda Rutherford, LPN [name], CMA   5-year risk: 1.2 % 1.1 %   Lifetime risk: 8.3 % 8.7 %            A: BCCCP exam with pap smear NO complaints.  P: Referred patient to the [facility] for a screening mammogram on mobile unit. Appointment scheduled Tuesday, July 14, 2021 at 1550.  Priscille Heidelberg, RN 07/14/2021 3:00 PM

## 2021-07-14 NOTE — Patient Instructions (Signed)
Explained breast self awareness with George Hugh. Pap smear completed today. Let patient know that if today's Pap smear is normal and HPV negative her next Pap smear will be due in one year due to her last Pap smear was abnormal. Referred patient to the [facility] for a screening mammogram on mobile unit. Appointment scheduled Tuesday, July 14, 2021 at 1550. Patient escorted to the mobile unit following BCCCP appointment for her screening mammogram. Let patient know will follow up with her within the next couple weeks with results of her Pap smear by phone. Informed patient that the Breast Center will follow-up with her within the next couple of weeks with results of her mammogram by letter or phone. George Hugh verbalized understanding.  Darlene Brozowski, Kathaleen Maser, RN 3:00 PM

## 2021-07-14 NOTE — Addendum Note (Signed)
Addended by: Narda Rutherford on: 07/14/2021 04:01 PM   Modules accepted: Orders

## 2021-07-16 ENCOUNTER — Telehealth: Payer: Self-pay

## 2021-07-16 LAB — CYTOLOGY - PAP
Comment: NEGATIVE
Diagnosis: NEGATIVE
High risk HPV: NEGATIVE

## 2021-07-16 NOTE — Telephone Encounter (Signed)
Patient informed negative Pap/HPV results, next pap due in 1 year. Patient verbalized understanding.  

## 2021-08-13 DIAGNOSIS — M25871 Other specified joint disorders, right ankle and foot: Secondary | ICD-10-CM | POA: Insufficient documentation

## 2022-05-17 ENCOUNTER — Other Ambulatory Visit: Payer: Self-pay

## 2022-05-17 DIAGNOSIS — Z1231 Encounter for screening mammogram for malignant neoplasm of breast: Secondary | ICD-10-CM

## 2022-07-20 ENCOUNTER — Ambulatory Visit: Payer: Self-pay | Admitting: Hematology and Oncology

## 2022-07-20 ENCOUNTER — Ambulatory Visit
Admission: RE | Admit: 2022-07-20 | Discharge: 2022-07-20 | Disposition: A | Discharge status: Home / Self Care | Payer: No Typology Code available for payment source | Source: Ambulatory Visit | Attending: Obstetrics and Gynecology | Admitting: Obstetrics and Gynecology

## 2022-07-20 VITALS — Wt 246.6 lb

## 2022-07-20 DIAGNOSIS — Z1211 Encounter for screening for malignant neoplasm of colon: Secondary | ICD-10-CM

## 2022-07-20 DIAGNOSIS — Z01419 Encounter for gynecological examination (general) (routine) without abnormal findings: Secondary | ICD-10-CM

## 2022-07-20 DIAGNOSIS — Z1231 Encounter for screening mammogram for malignant neoplasm of breast: Secondary | ICD-10-CM

## 2022-07-20 NOTE — Progress Notes (Signed)
Susan [name] is a 51 y.o. G3P0 female who presents to [facility] clinic today with no complaints .    Pap Smear: Pap smear completed today. Last Pap smear was 2022 at [facility] clinic and was normal. Per patient has history of an abnormal Pap smear in 2021 revealing ASCUS/+HPV with benign ECC and LSIL/CIN-I colposcopy.  Last Pap smear result is available in Epic.   Physical exam: Breasts Breasts symmetrical. No skin abnormalities bilateral breasts. No nipple retraction bilateral breasts. No nipple discharge bilateral breasts. No lymphadenopathy. No lumps palpated bilateral breasts.       Pelvic/Bimanual Ext Genitalia No lesions, no swelling and no discharge observed on external genitalia.        Vagina Vagina pink and normal texture. No lesions or discharge observed in vagina.        Cervix Cervix is present. Cervix pink and of normal texture. No discharge observed.    Uterus Uterus is present and palpable. Uterus in normal position and normal size.        Adnexae Bilateral ovaries present and palpable. No tenderness on palpation.         Rectovaginal No rectal exam completed today since patient had no rectal complaints. No skin abnormalities observed on exam.     Smoking History: Patient has is a former smoker who quit smoking 30 days ago and was not referred to quit line.    Patient Navigation: Patient education provided. Access to services provided for patient through [facility] program. No interpreter provided. No transportation provided   Colorectal Cancer Screening: Per patient has had colonoscopy completed on unsure date.  No complaints today.    Breast and Cervical Cancer Risk Assessment: Patient does not have family history of breast cancer, known genetic mutations, or radiation treatment to the chest before age 95. Patient does not have history of cervical dysplasia, immunocompromised, or DES exposure in-utero.  Risk Assessment   No risk assessment data for the current  encounter  Risk Scores       07/14/2021   Last edited by: Narda Rutherford, LPN   5-year risk: 1.2 %   Lifetime risk: 8.3 %            A: BCCCP exam with pap smear No complaints with benign exam.   P: Referred patient to the [facility] for a screening mammogram. Appointment scheduled 07/20/2022.  Ilda Basset A, NP 07/20/2022 1:48 PM

## 2022-07-23 LAB — CYTOLOGY - PAP
Adequacy: ABSENT
Comment: NEGATIVE
Diagnosis: NEGATIVE
High risk HPV: NEGATIVE

## 2022-07-24 LAB — FECAL OCCULT BLOOD, IMMUNOCHEMICAL: Fecal Occult Bld: NEGATIVE

## 2022-07-26 ENCOUNTER — Telehealth: Payer: Self-pay

## 2022-07-26 NOTE — Telephone Encounter (Signed)
Called patient to give pap smear results. Informed patient that pap smear was normal and HPV was negative. Based on this result and her previous hx her next pap smear will be due in 1 year. Patient voiced understanding. 

## 2023-07-04 ENCOUNTER — Other Ambulatory Visit: Payer: Self-pay

## 2023-07-04 DIAGNOSIS — Z1231 Encounter for screening mammogram for malignant neoplasm of breast: Secondary | ICD-10-CM

## 2023-08-04 ENCOUNTER — Ambulatory Visit: Payer: Medicaid Other

## 2023-09-08 ENCOUNTER — Encounter: Payer: Self-pay | Admitting: Obstetrics and Gynecology

## 2023-09-08 ENCOUNTER — Ambulatory Visit
Admission: RE | Admit: 2023-09-08 | Discharge: 2023-09-08 | Disposition: A | Discharge status: Home / Self Care | Payer: Medicaid Other | Source: Ambulatory Visit | Attending: Family Medicine | Admitting: Family Medicine

## 2023-09-08 ENCOUNTER — Other Ambulatory Visit (HOSPITAL_COMMUNITY)
Admission: RE | Admit: 2023-09-08 | Discharge: 2023-09-08 | Disposition: A | Discharge status: Home / Self Care | Payer: No Typology Code available for payment source | Source: Ambulatory Visit | Attending: Obstetrics and Gynecology | Admitting: Obstetrics and Gynecology

## 2023-09-08 ENCOUNTER — Ambulatory Visit: Payer: No Typology Code available for payment source | Admitting: Obstetrics and Gynecology

## 2023-09-08 VITALS — BP 105/63 | HR 60 | Wt 208.0 lb

## 2023-09-08 DIAGNOSIS — Z1330 Encounter for screening examination for mental health and behavioral disorders, unspecified: Secondary | ICD-10-CM

## 2023-09-08 DIAGNOSIS — Z1231 Encounter for screening mammogram for malignant neoplasm of breast: Secondary | ICD-10-CM

## 2023-09-08 DIAGNOSIS — Z0000 Encounter for general adult medical examination without abnormal findings: Secondary | ICD-10-CM | POA: Diagnosis not present

## 2023-09-08 DIAGNOSIS — Z113 Encounter for screening for infections with a predominantly sexual mode of transmission: Secondary | ICD-10-CM | POA: Diagnosis not present

## 2023-09-08 NOTE — Progress Notes (Signed)
ANNUAL EXAM Patient name: Susan [name] MRN [id number]  Date of birth: [birth year] Chief Complaint:   Gynecologic Exam  History of Present Illness:   Susan [name] is a 52 y.o. G3P0 being seen today for a routine annual exam.  Current complaints: annual  Declines STI testing today  Would like to proceed with pap thougth last 2 wer enromal   Menstrual concerns? No  postmenopausal, no bleeding Breast or nipple changes? No  Contraception use? No/postmenopausal  Sexually active? No   Patient's last menstrual period was 07/30/2015.   The pregnancy intention screening data noted above was reviewed. Potential methods of contraception were discussed. The patient elected to proceed with No data recorded.   Last pap     Component Value Date/Time   DIAGPAP  07/20/2022 1407    - Negative for intraepithelial lesion or malignancy (NILM)   DIAGPAP  07/14/2021 1504    - Negative for intraepithelial lesion or malignancy (NILM)   DIAGPAP (A) 05/15/2020 1358    - Atypical squamous cells of undetermined significance (ASC-US)   HPVHIGH Negative 07/20/2022 1407   HPVHIGH Negative 07/14/2021 1504   HPVHIGH Positive (A) 05/15/2020 1358   ADEQPAP  07/20/2022 1407    Satisfactory for evaluation; transformation zone component ABSENT.   ADEQPAP  07/14/2021 1504    Satisfactory for evaluation; transformation zone component PRESENT.   ADEQPAP  05/15/2020 1358    Satisfactory for evaluation; transformation zone component PRESENT.     H/O abnormal pap: yes ASCUS, HPV in 2021, LSIL colpo in 2021 Last mammogram: 07/2022 BIRADS 1.  Last colonoscopy: none seen .      06/26/2020    3:08 PM  Depression screen PHQ 2/9  Decreased Interest 0  Down, Depressed, Hopeless 0  PHQ - 2 Score 0  Altered sleeping 1  Tired, decreased energy 1  Change in appetite 1  Feeling bad or failure about yourself  0  Trouble concentrating 0  Moving slowly or fidgety/restless 0  Suicidal thoughts 0  PHQ-9 Score 3         06/26/2020    3:09 PM  GAD 7 : Generalized Anxiety Score  Nervous, Anxious, on Edge 1  Control/stop worrying 1  Worry too much - different things 1  Trouble relaxing 1  Restless 1  Easily annoyed or irritable 0  Afraid - awful might happen 0  Total GAD 7 Score 5     Review of Systems:   Pertinent items are noted in HPI Denies any headaches, blurred vision, fatigue, shortness of breath, chest pain, abdominal pain, abnormal vaginal discharge/itching/odor/irritation, problems with periods, bowel movements, urination, or intercourse unless otherwise stated above. Pertinent History Reviewed:  Reviewed past medical,surgical, social and family history.  Reviewed problem list, medications and allergies. Physical Assessment:   Vitals:   09/08/23 1049  BP: 105/63  Pulse: 60  Weight: 208 lb (94.3 kg)  Body mass index is 31.63 kg/m.        Physical Examination:   General appearance - well appearing, and in no distress  Mental status - alert, oriented to person, place, and time  Psych:  She has a normal mood and affect  Skin - warm and dry, normal color, no suspicious lesions noted  Chest - effort normal, all lung fields clear to auscultation bilaterally  Heart - normal rate and regular rhythm  Breasts - breasts appear normal, no suspicious masses, no skin or nipple changes or  axillary nodes  Abdomen - soft, nontender, nondistended,  no masses or organomegaly  Pelvic -  VULVA: normal appearing vulva with no masses, tenderness or lesions   VAGINA: normal appearing vagina with normal color and discharge, no lesions   CERVIX: normal appearing cervix without discharge or lesions, no CMT  Thin prep pap is done with HR HPV cotesting  UTERUS: uterus is felt to be normal size, shape, consistency and nontender   ADNEXA: No adnexal masses or tenderness noted.  Extremities:  No swelling or varicosities noted  Chaperone present for exam  No results found for this or any previous visit  (from the past 24 hour(s)).    Assessment & Plan:   1. Encounter for annual health examination - Cervical cancer screening: Discussed guidelines. Pap with HPV collected - Breast Health: Encouraged self breast awareness/SBE. Discussed limits of clinical breast exam for detecting breast cancer. Discussed importance of annual MXR. Rx given for MXR - Climacteric/Sexual health: Reviewed typical and atypical symptoms of menopause/peri-menopause. Discussed PMB and to call if any amount of spotting.  - Colonoscopy: Per PCP - F/U 12 months and prn  - Cytology - PAP( [place]) - MM 3D SCREENING MAMMOGRAM BILATERAL BREAST; Future  2. Screening examination for STI Pap collected. Last tests negative, noted that if this is negative, can do next in 3 years.    Orders Placed This Encounter  Procedures   MM 3D SCREENING MAMMOGRAM BILATERAL BREAST    Meds: No orders of the defined types were placed in this encounter.   Follow-up: Return if symptoms worsen or fail to improve, for Annual GYN.  Lorriane Shire, MD 09/08/2023 11:00 AM

## 2023-09-14 LAB — CYTOLOGY - PAP
Comment: NEGATIVE
Comment: NEGATIVE
Comment: NEGATIVE
Diagnosis: NEGATIVE
HPV 16: NEGATIVE
HPV 18 / 45: NEGATIVE
High risk HPV: POSITIVE — AB

## 2023-09-16 ENCOUNTER — Telehealth: Payer: Self-pay | Admitting: *Deleted

## 2023-09-16 NOTE — Telephone Encounter (Signed)
Per chart review patient has not read MyChart message sent by provider. I called patient and left  a message I was trying to reach you re: results and since I did not reach you- I wanted to let you know your provider sent you a MyChart message- please read and respond. Nancy Fetter

## 2023-09-16 NOTE — Telephone Encounter (Signed)
-----   Message from [place] sent at 09/15/2023  6:05 PM EDT ----- Note that pap shows normal cells but positive for high risk HPV therefore recommend repeat pap smear in 1 year

## 2023-09-21 NOTE — Telephone Encounter (Signed)
Contacted patient with lab results. Explain to patient the need for retesting in a year. Patient verbalized understanding. HPV information sent via MyChart.  B'Aisha, CMA

## 2023-10-04 ENCOUNTER — Ambulatory Visit: Payer: No Typology Code available for payment source

## 2023-10-07 ENCOUNTER — Encounter: Payer: Self-pay | Admitting: Obstetrics and Gynecology

## 2023-10-07 ENCOUNTER — Ambulatory Visit (INDEPENDENT_AMBULATORY_CARE_PROVIDER_SITE_OTHER): Payer: No Typology Code available for payment source | Admitting: Obstetrics and Gynecology

## 2023-10-07 VITALS — BP 109/65 | HR 66 | Ht 68.0 in | Wt 211.8 lb

## 2023-10-07 DIAGNOSIS — K5901 Slow transit constipation: Secondary | ICD-10-CM

## 2023-10-07 DIAGNOSIS — R350 Frequency of micturition: Secondary | ICD-10-CM

## 2023-10-07 DIAGNOSIS — M6289 Other specified disorders of muscle: Secondary | ICD-10-CM

## 2023-10-07 DIAGNOSIS — N3281 Overactive bladder: Secondary | ICD-10-CM | POA: Diagnosis not present

## 2023-10-07 DIAGNOSIS — N3289 Other specified disorders of bladder: Secondary | ICD-10-CM

## 2023-10-07 LAB — POCT URINALYSIS DIPSTICK
Bilirubin, UA: NEGATIVE
Blood, UA: NEGATIVE
Glucose, UA: NEGATIVE
Ketones, UA: NEGATIVE
Leukocytes, UA: NEGATIVE
Nitrite, UA: NEGATIVE
Protein, UA: NEGATIVE
Spec Grav, UA: 1.01 (ref 1.010–1.025)
Urobilinogen, UA: 0.2 U/dL
pH, UA: 7 (ref 5.0–8.0)

## 2023-10-07 MED ORDER — PHENAZOPYRIDINE HCL 95 MG PO TABS: 95.0000 mg | ORAL_TABLET | Freq: Three times a day (TID) | ORAL | 3 refills | Status: DC | PRN
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
## 2023-10-07 NOTE — Patient Instructions (Addendum)
Today we talked about ways to manage bladder urgency such as altering your diet to avoid irritative beverages and foods (bladder diet) as well as attempting to decrease stress and other exacerbating factors.  You can also chew a plain Tums 1-3 times per day to make your urine less acidic, especially if you have eating/drinking acidic things.    The Most Bothersome Foods* The Least Bothersome Foods*  Coffee - Regular & Decaf Tea - caffeinated Carbonated beverages - cola, non-colas, diet & caffeine-free Alcohols - Beer, Red Wine, White Wine, [address] - Grapefruit, A[employer], Orange, [employer] - Cranberry, Grapefruit, Orange, Pineapple Vegetables - Tomato & Tomato Products Flavor Enhancers - Hot peppers, Spicy foods, Chili, Horseradish, Vinegar, Monosodium glutamate (MSG) Artificial Sweeteners - NutraSweet, Sweet 'N Low, Equal (sweetener), Saccharin Ethnic foods - [place], [place], [place] food [employer] - low-fat & whole Fruits - Bananas, Blueberries, Honeydew melon, Pears, Raisins, Watermelon Vegetables - Broccoli, [address] Sprouts, [place], Carrots, Cauliflower, [place], Cucumber, Mushrooms, Peas, Radishes, Squash, Zucchini, White potatoes, Sweet potatoes & yams Poultry - Chicken, Eggs, [place], [employer] - Beef, Diplomatic Services operational officer, Lamb Seafood - Shrimp, [place] fish, Salmon Grains - Oat, Rice Snacks - Pretzels, Popcorn  *Lenward Chancellor et al. Diet and its role in interstitial cystitis/bladder pain syndrome (IC/BPS) and comorbid conditions. [employer]. BJU Int. 2012 Jan 11.  Constipation: Our goal is to achieve formed bowel movements daily or every-other-day.  You may need to try different combinations of the following options to find what works best for you - everybody's body works differently so feel free to adjust the dosages as needed.  Some options to help maintain bowel health include: "Power Pudding" is a natural mixture that may help your constipation.  To make blend 1 cup applesauce, 1  cup wheat bran, and 3/4 cup prune juice, refrigerate and then take 1 tablespoon daily with a large glass of water as needed. Get sugar free prune juice  If you choose to be sexually active used a silicon based lubricant for intercourse as this will not get tacky and dry.   If you have some mild vaginal dryness you can use coconut oil to soothe the area. The cooking coconut oil is the best kind to use.   You have stage 1 uterine prolapse, stage 1 bladder prolapse, and stage 1 posterior (rectal wall) prolapse  Consider doing the low acid coffee or herbal teas. Green teas and black teas are really irritative to the bladder.   Pumpkin seed extract is really good for bladder urgency. Up to 5gm (5000mg ) daily can be helpful for managing bladder urgency.

## 2023-10-25 ENCOUNTER — Ambulatory Visit: Payer: No Typology Code available for payment source | Admitting: Obstetrics and Gynecology

## 2023-12-15 ENCOUNTER — Other Ambulatory Visit: Payer: Self-pay

## 2023-12-15 ENCOUNTER — Encounter: Payer: Self-pay | Admitting: Physical Therapy

## 2023-12-15 ENCOUNTER — Encounter
Payer: No Typology Code available for payment source | Attending: Obstetrics and Gynecology | Admitting: Physical Therapy

## 2023-12-15 DIAGNOSIS — M6281 Muscle weakness (generalized): Secondary | ICD-10-CM | POA: Insufficient documentation

## 2023-12-15 DIAGNOSIS — M5459 Other low back pain: Secondary | ICD-10-CM | POA: Insufficient documentation

## 2023-12-15 DIAGNOSIS — M25652 Stiffness of left hip, not elsewhere classified: Secondary | ICD-10-CM | POA: Insufficient documentation

## 2023-12-15 DIAGNOSIS — M25651 Stiffness of right hip, not elsewhere classified: Secondary | ICD-10-CM | POA: Diagnosis present
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
## 2023-12-22 ENCOUNTER — Encounter: Payer: Medicaid Other | Attending: Obstetrics and Gynecology | Admitting: Physical Therapy

## 2023-12-22 ENCOUNTER — Telehealth: Payer: Self-pay | Admitting: Physical Therapy

## 2023-12-22 DIAGNOSIS — M25651 Stiffness of right hip, not elsewhere classified: Secondary | ICD-10-CM | POA: Insufficient documentation

## 2023-12-22 DIAGNOSIS — M5459 Other low back pain: Secondary | ICD-10-CM | POA: Insufficient documentation

## 2023-12-22 DIAGNOSIS — M25652 Stiffness of left hip, not elsewhere classified: Secondary | ICD-10-CM | POA: Insufficient documentation

## 2023-12-22 DIAGNOSIS — M6281 Muscle weakness (generalized): Secondary | ICD-10-CM | POA: Insufficient documentation

## 2023-12-22 NOTE — Telephone Encounter (Signed)
 Called patient about her missed appointment today at 8:30. Left a message.  Marsha Skeen, PT @2 /6/25@ 8:46 AM

## 2023-12-29 ENCOUNTER — Encounter: Payer: No Typology Code available for payment source | Admitting: Physical Therapy

## 2024-01-05 ENCOUNTER — Encounter: Payer: Self-pay | Admitting: Physical Therapy

## 2024-01-05 ENCOUNTER — Encounter: Payer: Medicaid Other | Admitting: Physical Therapy

## 2024-01-05 DIAGNOSIS — M25652 Stiffness of left hip, not elsewhere classified: Secondary | ICD-10-CM

## 2024-01-05 DIAGNOSIS — M5459 Other low back pain: Secondary | ICD-10-CM | POA: Diagnosis present

## 2024-01-05 DIAGNOSIS — M25651 Stiffness of right hip, not elsewhere classified: Secondary | ICD-10-CM | POA: Diagnosis present

## 2024-01-05 DIAGNOSIS — M6281 Muscle weakness (generalized): Secondary | ICD-10-CM | POA: Diagnosis present
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
## 2024-01-09 ENCOUNTER — Ambulatory Visit: Payer: No Typology Code available for payment source | Admitting: Obstetrics and Gynecology

## 2024-01-10 ENCOUNTER — Encounter: Payer: Self-pay | Admitting: Physical Therapy

## 2024-01-10 ENCOUNTER — Encounter: Payer: Medicaid Other | Admitting: Physical Therapy

## 2024-01-10 DIAGNOSIS — M5459 Other low back pain: Secondary | ICD-10-CM

## 2024-01-10 DIAGNOSIS — M6281 Muscle weakness (generalized): Secondary | ICD-10-CM | POA: Diagnosis not present

## 2024-01-10 DIAGNOSIS — M25651 Stiffness of right hip, not elsewhere classified: Secondary | ICD-10-CM

## 2024-01-10 DIAGNOSIS — M25652 Stiffness of left hip, not elsewhere classified: Secondary | ICD-10-CM

## 2024-01-10 NOTE — Patient Instructions (Addendum)
 Bladder Irritants  Certain foods and beverages can be irritating to the bladder.  Avoiding these irritants may decrease your symptoms of urinary urgency, frequency or bladder pain.  Even reducing your intake can help with your symptoms.  Not everyone is sensitive to all bladder irritants, so you may consider focusing on one irritant at a time, removing or reducing your intake of that irritant for 7-10 days to see if this change helps your symptoms.  Water intake is also very important.  Below is a list of bladder irritants.  Drinks: alcohol, carbonated beverages, caffeinated beverages such as coffee and tea, drinks with artificial sweeteners, citrus juices, apple juice, tomato juice  Foods: tomatoes and tomato based foods, spicy food, sugar and artificial sweeteners, vinegar, chocolate, raw onion, apples, citrus fruits, pineapple, cranberries, tomatoes, strawberries, plums, peaches, cantaloupe  Other: acidic urine (too concentrated) - see water intake info below  Substitutes you can try that are NOT irritating to the bladder: cooked onion, pears, papayas, sun-brewed decaf teas, watermelons, non-citrus herbal teas, apricots, kava and low-acid instant drinks (Postum).    WATER INTAKE: Remember to drink lots of water (aim for fluid intake of half your body weight with 2/3 of fluids being water).  You may be limiting fluids due to fear of leakage, but this can actually worsen urgency symptoms due to highly concentrated urine.  Water helps balance the pH of your urine so it doesn't become too acidic - acidic urine is a bladder irritant!   Eulis Foster, PT [facility] [address] [place], [place] [address] W: [phone] [email or web address]  Urge Incontinence  Ideal urination frequency is every 2-4 wakeful hours, which equates to 5-8 times within a 24-hour period.   Urge incontinence is leakage that occurs when the bladder muscle contracts, creating a  sudden need to go before getting to the bathroom.   Going too often when your bladder isn't actually full can disrupt the body's automatic signals to store and hold urine longer, which will increase urgency/frequency.  In this case, the bladder "is running the show" and strategies can be learned to retrain this pattern.   One should be able to control the first urge to urinate, at around .  The bladder can hold up to a "grande latte," or . To help you gain control, practice the Urge Drill below when urgency strikes.  This drill will help retrain your bladder signals and allow you to store and hold urine longer.  The overall goal is to stretch out your time between voids to reach a more manageable voiding schedule.    Practice your "quick flicks" often throughout the day (each waking hour) even when you don't need feel the urge to go.  This will help strengthen your pelvic floor muscles, making them more effective in controlling leakage.  Urge Drill  When you feel an urge to go, follow these steps to regain control: Stop what you are doing and be still Take one deep breath, directing your air into your abdomen Think an affirming thought, such as "I've got this." Do 5 quick flicks of your pelvic floor Walk with control to the bathroom to void, or delay voiding    Trigger Point Dry Needling  What is Trigger Point Dry Needling (DN)? DN is a physical therapy technique used to treat muscle pain and dysfunction. Specifically, DN helps deactivate muscle trigger points (muscle knots).  A thin filiform needle is used to penetrate the skin and stimulate the underlying trigger  point. The goal is for a local twitch response (LTR) to occur and for the trigger point to relax. No medication of any kind is injected during the procedure.   What Does Trigger Point Dry Needling Feel Like?  The procedure feels different for each individual patient. Some patients report that they do not actually feel the  needle enter the skin and overall the process is not painful. Very mild bleeding may occur. However, many patients feel a deep cramping in the muscle in which the needle was inserted. This is the local twitch response.   How Will I feel after the treatment? Soreness is normal, and the onset of soreness may not occur for a few hours. Typically this soreness does not last longer than two days.  Bruising is uncommon, however; ice can be used to decrease any possible bruising.  In rare cases feeling tired or nauseous after the treatment is normal. In addition, your symptoms may get worse before they get better, this period will typically not last longer than 24 hours.   What Can I do After My Treatment? Increase your hydration by drinking more water for the next 24 hours.  You may place ice or heat on the areas treated that have become sore, however, do not use heat on inflamed or bruised areas. Heat often brings more relief post needling. You can continue your regular activities, but vigorous activity is not recommended initially after the treatment for 24 hours. DN is best combined with other physical therapy such as strengthening, stretching, and other therapies.   What are the complications? While your therapist has had extensive training in minimizing the risks of trigger point dry needling, it is important to understand the risks of any procedure.  Risks include bleeding, pain, fatigue, hematoma, infection, vertigo, nausea or nerve involvement. Monitor for any changes to your skin or sensation. Contact your therapist or MD with concerns.  A rare but serious complication is a pneumothorax over or near your middle and upper chest and back If you have dry needling in this area, monitor for the following symptoms: Shortness of breath on exertion and/or Difficulty taking a deep breath and/or Chest Pain and/or A dry cough If any of the above symptoms develop, please go to the nearest emergency room  or call 911. Tell them you had dry needling over your thorax and report any symptoms you are having. Please follow-up with your treating therapist after you complete the medical evaluation.   [facility]  [address] [place] [address].  [phone]
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
## 2024-01-23 ENCOUNTER — Ambulatory Visit (INDEPENDENT_AMBULATORY_CARE_PROVIDER_SITE_OTHER): Payer: No Typology Code available for payment source | Admitting: Obstetrics and Gynecology

## 2024-01-23 ENCOUNTER — Encounter: Payer: Self-pay | Admitting: Obstetrics and Gynecology

## 2024-01-23 VITALS — BP 94/63 | HR 69

## 2024-01-23 DIAGNOSIS — K5901 Slow transit constipation: Secondary | ICD-10-CM

## 2024-01-23 DIAGNOSIS — N816 Rectocele: Secondary | ICD-10-CM

## 2024-01-23 DIAGNOSIS — N3281 Overactive bladder: Secondary | ICD-10-CM

## 2024-01-23 MED ORDER — MIRABEGRON ER 25 MG PO TB24
25.0000 mg | ORAL_TABLET | Freq: Every day | ORAL | 5 refills | Status: DC
Start: 2024-01-23 — End: 2024-05-16

## 2024-01-23 NOTE — Progress Notes (Signed)
 [facility] Return Visit  SUBJECTIVE  History of Present Illness: Susan [name] is a 53 y.o. female seen in follow-up for OAB. Plan at last visit was start pumpkin seed extract and pelvic floor PT.  Patient reports she has seen PT x3 times and is doing okay with this but does not feel like it has helped her bladder.   Patient reports that she did not start pumpkin seed extract.    Patient reports she is trying to eat well but still sometimes has constipation.    Past Medical History: Patient  has a past medical history of Anemia, Anxiety, Depression, Headache, Obesity, and Type II diabetes mellitus (HCC).   Past Surgical History: She  has a past surgical history that includes Tubal ligation and wisdom tooth cyst removed.   Medications: She has a current medication list which includes the following prescription(s): aspirin ec, levetiracetam, metformin, mirabegron er, and phenazopyridine.   Allergies: Patient is allergic to cymbalta [duloxetine hcl] and divalproex sodium.   Social History: Patient  reports that she has been smoking cigarettes. She has never used smokeless tobacco. She reports current alcohol use. She reports that she does not use drugs.     OBJECTIVE     Physical Exam: Vitals:   01/23/24 0841  BP: 94/63  Pulse: 69   Gen: No apparent distress, A&O x 3.  Detailed Urogynecologic Evaluation:  Deferred.   ASSESSMENT AND PLAN    Susan [name] is a 53 y.o. with:  1. OAB (overactive bladder)   2. Posterior vaginal wall prolapse   3. Slow transit constipation    As patient has not seen significant improvement with her OAB symptoms with PT, will start Myrbetriq 25mg  daily. We reviewed to watch for increase in BP, headaches, or visual changes. She is also planning to continue PT because she reports overall it does make her feel better.  We reviewed what it means to have posterior vaginal wall prolapse. Hers is not typically bothersome but she does  notice sometimes with harder stools it can be harder to have a BM. We discussed getting a squatty potty for assistance with this as well to reduce pressure on the rectum.  Patient to try squatty potty for BM.   Patient to follow up in 2 months for medication check and follow up with more PT.    Selmer Dominion, NP

## 2024-01-24 ENCOUNTER — Encounter: Attending: Obstetrics and Gynecology | Admitting: Physical Therapy

## 2024-01-24 ENCOUNTER — Encounter: Payer: Self-pay | Admitting: Physical Therapy

## 2024-01-24 DIAGNOSIS — M5459 Other low back pain: Secondary | ICD-10-CM | POA: Diagnosis present

## 2024-01-24 DIAGNOSIS — M25651 Stiffness of right hip, not elsewhere classified: Secondary | ICD-10-CM | POA: Diagnosis present

## 2024-01-24 DIAGNOSIS — M25652 Stiffness of left hip, not elsewhere classified: Secondary | ICD-10-CM | POA: Insufficient documentation

## 2024-01-24 DIAGNOSIS — M6281 Muscle weakness (generalized): Secondary | ICD-10-CM | POA: Insufficient documentation
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
## 2024-02-07 ENCOUNTER — Encounter: Payer: Self-pay | Admitting: Physical Therapy

## 2024-02-07 ENCOUNTER — Encounter: Admitting: Physical Therapy

## 2024-02-07 DIAGNOSIS — M6281 Muscle weakness (generalized): Secondary | ICD-10-CM | POA: Diagnosis not present

## 2024-02-07 DIAGNOSIS — M5459 Other low back pain: Secondary | ICD-10-CM

## 2024-02-07 DIAGNOSIS — M25651 Stiffness of right hip, not elsewhere classified: Secondary | ICD-10-CM

## 2024-02-07 DIAGNOSIS — M25652 Stiffness of left hip, not elsewhere classified: Secondary | ICD-10-CM
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
## 2024-02-15 ENCOUNTER — Ambulatory Visit: Attending: Obstetrics and Gynecology | Admitting: Physical Therapy

## 2024-02-15 ENCOUNTER — Encounter: Payer: Self-pay | Admitting: Physical Therapy

## 2024-02-15 DIAGNOSIS — M5459 Other low back pain: Secondary | ICD-10-CM | POA: Diagnosis present

## 2024-02-15 DIAGNOSIS — M25651 Stiffness of right hip, not elsewhere classified: Secondary | ICD-10-CM | POA: Insufficient documentation

## 2024-02-15 DIAGNOSIS — M6281 Muscle weakness (generalized): Secondary | ICD-10-CM | POA: Insufficient documentation

## 2024-02-15 DIAGNOSIS — M25652 Stiffness of left hip, not elsewhere classified: Secondary | ICD-10-CM | POA: Diagnosis present
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
## 2024-02-20 ENCOUNTER — Ambulatory Visit: Admitting: Physical Therapy

## 2024-02-20 ENCOUNTER — Encounter: Payer: Self-pay | Admitting: Physical Therapy

## 2024-02-20 DIAGNOSIS — M5459 Other low back pain: Secondary | ICD-10-CM

## 2024-02-20 DIAGNOSIS — M6281 Muscle weakness (generalized): Secondary | ICD-10-CM | POA: Diagnosis not present

## 2024-02-20 DIAGNOSIS — M25652 Stiffness of left hip, not elsewhere classified: Secondary | ICD-10-CM

## 2024-02-20 DIAGNOSIS — M25651 Stiffness of right hip, not elsewhere classified: Secondary | ICD-10-CM

## 2024-02-20 NOTE — Patient Instructions (Addendum)
 Urge Incontinence  Ideal urination frequency is every 2-4 wakeful hours, which equates to 5-8 times within a 24-hour period.   Urge incontinence is leakage that occurs when the bladder muscle contracts, creating a sudden need to go before getting to the bathroom.   Going too often when your bladder isn't actually full can disrupt the body's automatic signals to store and hold urine longer, which will increase urgency/frequency.  In this case, the bladder "is running the show" and strategies can be learned to retrain this pattern.   One should be able to control the first urge to urinate, at around .  The bladder can hold up to a "grande latte," or . To help you gain control, practice the Urge Drill below when urgency strikes.  This drill will help retrain your bladder signals and allow you to store and hold urine longer.  The overall goal is to stretch out your time between voids to reach a more manageable voiding schedule.    Practice your "quick flicks" often throughout the day (each waking hour) even when you don't need feel the urge to go.  This will help strengthen your pelvic floor muscles, making them more effective in controlling leakage.  Urge Drill  When you feel an urge to go, follow these steps to regain control: Stop what you are doing and be still Take one deep breath, directing your air into your abdomen Think an affirming thought, such as "I've got this." Do 5 quick flicks of your pelvic floor Walk with control to the bathroom to void, or delay voiding  [facility] [address], [address] [place], [place] [address] Phone # [phone] Fax [phone]
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
## 2024-03-06 ENCOUNTER — Encounter: Payer: Self-pay | Admitting: Physical Therapy

## 2024-03-06 ENCOUNTER — Encounter: Attending: Obstetrics and Gynecology | Admitting: Physical Therapy

## 2024-03-06 DIAGNOSIS — M25652 Stiffness of left hip, not elsewhere classified: Secondary | ICD-10-CM

## 2024-03-06 DIAGNOSIS — M5459 Other low back pain: Secondary | ICD-10-CM

## 2024-03-06 DIAGNOSIS — M6281 Muscle weakness (generalized): Secondary | ICD-10-CM

## 2024-03-06 DIAGNOSIS — M25651 Stiffness of right hip, not elsewhere classified: Secondary | ICD-10-CM

## 2024-03-06 NOTE — Patient Instructions (Addendum)
Urge Incontinence  Ideal urination frequency is every 2-4 wakeful hours, which equates to 5-8 times within a 24-hour period.   Urge incontinence is leakage that occurs when the bladder muscle contracts, creating a sudden need to go before getting to the bathroom.   Going too often when your bladder isn't actually full can disrupt the body's automatic signals to store and hold urine longer, which will increase urgency/frequency.  In this case, the bladder "is running the show" and strategies can be learned to retrain this pattern.   One should be able to control the first urge to urinate, at around 150mL.  The bladder can hold up to a "grande latte," or 400mL. To help you gain control, practice the Urge Drill below when urgency strikes.  This drill will help retrain your bladder signals and allow you to store and hold urine longer.  The overall goal is to stretch out your time between voids to reach a more manageable voiding schedule.    Practice your "quick flicks" often throughout the day (each waking hour) even when you don't need feel the urge to go.  This will help strengthen your pelvic floor muscles, making them more effective in controlling leakage.  Urge Drill  When you feel an urge to go, follow these steps to regain control: Stop what you are doing and be still Take one deep breath, directing your air into your abdomen Think an affirming thought, such as "I've got this." Do 5 quick flicks of your pelvic floor Walk with control to the bathroom to void, or delay voiding    Kensey Luepke, PT [facility] [address], [address] [place], [place] [address] W: [phone] [email or web address]  
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
## 2024-03-14 ENCOUNTER — Encounter: Payer: Self-pay | Admitting: Physical Therapy

## 2024-03-27 ENCOUNTER — Encounter: Payer: Self-pay | Admitting: Physical Therapy

## 2024-03-30 ENCOUNTER — Ambulatory Visit: Admitting: Obstetrics and Gynecology

## 2024-04-03 ENCOUNTER — Encounter: Admitting: Physical Therapy

## 2024-05-03 ENCOUNTER — Encounter: Admitting: Physical Therapy

## 2024-05-16 ENCOUNTER — Other Ambulatory Visit: Payer: Self-pay | Admitting: Obstetrics and Gynecology

## 2024-05-16 ENCOUNTER — Ambulatory Visit: Admitting: Obstetrics and Gynecology

## 2024-05-16 DIAGNOSIS — N3281 Overactive bladder: Secondary | ICD-10-CM

## 2024-05-16 DIAGNOSIS — R350 Frequency of micturition: Secondary | ICD-10-CM

## 2024-05-16 MED ORDER — OXYBUTYNIN CHLORIDE ER 5 MG PO TB24: 5.0000 mg | ORAL_TABLET | Freq: Every day | ORAL | 1 refills | Status: DC

## 2024-05-29 ENCOUNTER — Encounter: Payer: Self-pay | Admitting: Physical Therapy

## 2024-05-29 ENCOUNTER — Encounter: Attending: Obstetrics and Gynecology | Admitting: Physical Therapy

## 2024-05-29 DIAGNOSIS — M5459 Other low back pain: Secondary | ICD-10-CM | POA: Diagnosis present

## 2024-05-29 DIAGNOSIS — M25652 Stiffness of left hip, not elsewhere classified: Secondary | ICD-10-CM | POA: Diagnosis present

## 2024-05-29 DIAGNOSIS — M25651 Stiffness of right hip, not elsewhere classified: Secondary | ICD-10-CM | POA: Diagnosis present

## 2024-05-29 DIAGNOSIS — M6281 Muscle weakness (generalized): Secondary | ICD-10-CM | POA: Insufficient documentation
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
## 2024-06-19 ENCOUNTER — Ambulatory Visit (INDEPENDENT_AMBULATORY_CARE_PROVIDER_SITE_OTHER): Admitting: Obstetrics and Gynecology

## 2024-06-19 ENCOUNTER — Encounter: Payer: Self-pay | Admitting: Obstetrics and Gynecology

## 2024-06-19 VITALS — BP 106/68 | HR 66

## 2024-06-19 DIAGNOSIS — N3281 Overactive bladder: Secondary | ICD-10-CM | POA: Diagnosis not present

## 2024-06-19 DIAGNOSIS — R350 Frequency of micturition: Secondary | ICD-10-CM

## 2024-06-19 MED ORDER — OXYBUTYNIN CHLORIDE ER 10 MG PO TB24: 10.0000 mg | ORAL_TABLET | Freq: Every day | ORAL | 5 refills | Status: DC

## 2024-06-19 NOTE — Progress Notes (Signed)
 [facility] Return Visit  SUBJECTIVE  History of Present Illness: Susan [name] is a 53 y.o. female seen in follow-up for OAB. She reports she is urinating 5-6 times during the day and is up at nighttime multiple times which is frustrating. She reports she is not getting enough sleep. Patient states she has only been taking the Oxybutynin  for 1 month due to the insurance not covering the Myrbetriq .   Patient endorses frustration at also being prescribed pyridium  back in November and states it is not helpful.   She endorses that she was put on a medication in 2013 that really helped her bladder but cannot recall the name or the doctor who prescribed it.    Patient reports she completed 9 sessions of PT and has not seen significant improvement.   She also endorses that she has a back problem and that could be contributing to her bladder issues.   Past Medical History: Patient  has a past medical history of Anemia, Anxiety, Depression, Headache, Obesity, and Type II diabetes mellitus (HCC).   Past Surgical History: She  has a past surgical history that includes Tubal ligation and wisdom tooth cyst removed.   Medications: She has a current medication list which includes the following prescription(s): aspirin  ec, iron, levetiracetam, magnesium, metformin , oxybutynin , rosuvastatin, and thiamine.   Allergies: Patient is allergic to cymbalta [duloxetine hcl] and divalproex sodium.   Social History: Patient  reports that she has quit smoking. Her smoking use included cigarettes. She has never used smokeless tobacco. She reports current alcohol use. She reports current drug use. Drug: Marijuana.     OBJECTIVE     Physical Exam: Vitals:   06/19/24 1445  BP: 106/68  Pulse: 66   Gen: No apparent distress, A&O x 3.  Detailed Urogynecologic Evaluation:  Deferred.   ASSESSMENT AND PLAN    Ms. Chilcote is a 53 y.o. with:  1. OAB (overactive bladder)   2. Urinary frequency     Will increase patient's oxybutynin  to 10mg  per day.  Patient to let us  know if she has symptoms. Will follow up in 3-4 weeks and see if this has been effective to her and if not we can further discuss tertiary options or another medication.    Richel Millspaugh G Taber Sweetser, NP

## 2024-07-06 ENCOUNTER — Other Ambulatory Visit: Payer: Self-pay | Admitting: Obstetrics and Gynecology

## 2024-07-09 ENCOUNTER — Other Ambulatory Visit: Payer: Self-pay | Admitting: Obstetrics and Gynecology

## 2024-07-10 ENCOUNTER — Telehealth: Payer: Self-pay

## 2024-07-10 NOTE — Telephone Encounter (Signed)
 Patient stated the medication oxybutynin  (DITROPAN -XL) isn't working for her and she is willing to try a different medication.

## 2024-07-12 ENCOUNTER — Other Ambulatory Visit: Payer: Self-pay | Admitting: Obstetrics and Gynecology

## 2024-07-12 MED ORDER — SOLIFENACIN SUCCINATE 10 MG PO TABS: 10.0000 mg | ORAL_TABLET | Freq: Every day | ORAL | 5 refills | Status: DC

## 2024-07-12 NOTE — Progress Notes (Signed)
 Patient reports no improvement in Oxybutynin  increase. Will change medication to Vesicare  and plan to follow up after a few weeks on this medication. Called and left message for patient to return call if any concerns.

## 2024-10-09 ENCOUNTER — Encounter: Payer: Self-pay | Admitting: Obstetrics and Gynecology

## 2024-10-09 ENCOUNTER — Ambulatory Visit (INDEPENDENT_AMBULATORY_CARE_PROVIDER_SITE_OTHER): Admitting: Obstetrics and Gynecology

## 2024-10-09 VITALS — BP 114/75 | HR 82

## 2024-10-09 DIAGNOSIS — K5901 Slow transit constipation: Secondary | ICD-10-CM | POA: Diagnosis not present

## 2024-10-09 DIAGNOSIS — R351 Nocturia: Secondary | ICD-10-CM | POA: Diagnosis not present

## 2024-10-09 DIAGNOSIS — N3281 Overactive bladder: Secondary | ICD-10-CM | POA: Diagnosis not present

## 2024-10-09 MED ORDER — VIBEGRON 75 MG PO TABS: 75.0000 mg | ORAL_TABLET | Freq: Every day | ORAL | 5 refills | Status: AC

## 2024-10-09 NOTE — Patient Instructions (Signed)
 Please start Gemtesa  75mg  daily and we will work with your insurance to see if we can get this covered.   Please call and schedule your sleep study  We will schedule you for Urodynamics.

## 2024-10-09 NOTE — Progress Notes (Signed)
 [facility] Return Visit  SUBJECTIVE  History of Present Illness: Susan [name] is a 53 y.o. female seen in follow-up for OAB/Nocturia. Plan at last visit was increase Oxybutynin  and she reported no improvement so we switched to Vesicare . Patient reports still no improvement in bladder function and states she is lucky to get 4 hours of sleep at night with her OAB symptoms.      Past Medical History: Patient  has a past medical history of Anemia, Anxiety, Depression, Headache, Obesity, and Type II diabetes mellitus (HCC).   Past Surgical History: She  has a past surgical history that includes Tubal ligation and wisdom tooth cyst removed.   Medications: She has a current medication list which includes the following prescription(s): aspirin  ec, iron, levetiracetam, magnesium, metformin , rosuvastatin, thiamine, and vibegron .   Allergies: Patient is allergic to divalproex sodium, influenza vac split quad, cymbalta [duloxetine hcl], influenza virus vaccine, and divalproex sodium.   Social History: Patient  reports that she has quit smoking. Her smoking use included cigarettes. She has never used smokeless tobacco. She reports current alcohol use. She reports current drug use. Drug: Marijuana.     OBJECTIVE     Physical Exam: Vitals:   10/09/24 1237  BP: 114/75  Pulse: 82   Gen: No apparent distress, A&O x 3.  Detailed Urogynecologic Evaluation:  Deferred.    ASSESSMENT AND PLAN    Ms. Round is a 53 y.o. with:  1. Nocturia   2. OAB (overactive bladder)   3. Slow transit constipation    Patient reports she is still struggling with her nocturia the most. We discussed doing a sleep apnea study. Referral placed for sleep studies to rule out OSA as a cause.  Patient has failed Myrbetriq , Oxybutynin  5mg  and 10mg , and Vesicare . She is not a good candidate for other anticholinergics due to her constipation issues. Gemtesa  75mg  daily is the best option for patient.  Pateint will also plan to undergo urodynamics. If medication is not effective she may consider botox vs. SNM and speak to a surgeon.  Patient to continue to support her bowels with diet and water intake.   Patient to return for urodynamics.   Ocie Tino G Pravin Perezperez, NP

## 2024-10-24 ENCOUNTER — Other Ambulatory Visit (HOSPITAL_COMMUNITY)
Admission: RE | Admit: 2024-10-24 | Discharge: 2024-10-24 | Disposition: A | Discharge status: Home / Self Care | Source: Ambulatory Visit | Attending: Obstetrics and Gynecology | Admitting: Obstetrics and Gynecology

## 2024-10-24 ENCOUNTER — Ambulatory Visit: Admitting: Obstetrics and Gynecology

## 2024-10-24 ENCOUNTER — Other Ambulatory Visit: Payer: Self-pay

## 2024-10-24 ENCOUNTER — Telehealth: Payer: Self-pay

## 2024-10-24 ENCOUNTER — Encounter: Payer: Self-pay | Admitting: Obstetrics and Gynecology

## 2024-10-24 VITALS — BP 118/70 | HR 94 | Wt 234.0 lb

## 2024-10-24 DIAGNOSIS — Z124 Encounter for screening for malignant neoplasm of cervix: Secondary | ICD-10-CM | POA: Diagnosis not present

## 2024-10-24 DIAGNOSIS — B977 Papillomavirus as the cause of diseases classified elsewhere: Secondary | ICD-10-CM

## 2024-10-24 DIAGNOSIS — Z1231 Encounter for screening mammogram for malignant neoplasm of breast: Secondary | ICD-10-CM | POA: Diagnosis not present

## 2024-10-24 NOTE — Telephone Encounter (Signed)
 See previous message

## 2024-10-24 NOTE — Progress Notes (Signed)
 Obstetrics and Gynecology Visit Established Patient Evaluation  Appointment Date: 10/24/2024  Primary Care Provider: Chrystal Lamarr GORMAN ROSARIO Clinic: [facility]  Chief Complaint: follow up pap smear  History of Present Illness:  Susan [name] is a 53 y.o. (LMP: late 52s) seen by Dr. Jeralyn for an annual exam and had a negative cytology pap but +HPV. Patient is here for follow up pap. Recent pap history: 08/2023 pap NILM/HPV+, negative 16/18/45 07/2022 pap NILM/HPV neg  Patient would like a breast exam.   Review of Systems: as noted in the History of Present Illness.  Patient Active Problem List   Diagnosis Date Noted   Impingement syndrome of right ankle 08/13/2021   Sciatica 05/06/2021   Chronic low back pain 03/18/2021   Intractable migraine without aura and without status migrainosus 11/21/2015   Nonallopathic lesion of cervical region 04/25/2015   Nonallopathic lesion of thoracic region 04/25/2015   Nonallopathic lesion-rib cage 04/25/2015   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 04/17/2015   Preventative health care 07/08/2014   Food allergy 09/11/2012   Diabetes mellitus type II, controlled (HCC) 07/31/2012   Glucosuria 07/28/2012   Depression 08/21/2008   Anxiety state 02/28/2008   Medications:  Osa Fogarty. Kinsella had no medications administered during this visit. Current Outpatient Medications  Medication Sig Dispense Refill   aspirin  EC 81 MG tablet Take 1 tablet (81 mg total) by mouth daily.     Ferrous Sulfate (IRON) 325 (65 Fe) MG TABS 1 tablet Orally Three times a Week     levETIRAcetam (KEPPRA) 750 MG tablet Take by mouth.     Magnesium 250 MG TABS 1 tablet with a meal Orally Once a day     metFORMIN  (GLUCOPHAGE ) 500 MG tablet TAKE 1.5 TABLETS (750 MG TOTAL) BY MOUTH 2 (TWO) TIMES DAILY WITH A MEAL. (Patient taking differently: Take 1,000 mg by mouth. TAKE 1.5 TABLETS (750 MG  TOTAL) BY MOUTH 2 (TWO) TIMES DAILY WITH A MEAL.) 270 tablet 1   OZEMPIC, 0.25 OR 0.5 MG/DOSE, 2 MG/3ML SOPN      rosuvastatin (CRESTOR) 10 MG tablet Take 10 mg by mouth at bedtime.     thiamine (VITAMIN B1) 100 MG tablet 2 tablets Orally Once a day     Vibegron  75 MG TABS Take 1 tablet (75 mg total) by mouth daily. (Patient not taking: Reported on 10/24/2024) 30 tablet 5   No current facility-administered medications for this visit.    Allergies: is allergic to divalproex sodium, influenza vac split quad, cymbalta [duloxetine hcl], influenza virus vaccine, and divalproex sodium.  Physical Exam:  BP 118/70   Pulse 94   Wt 234 lb (106.1 kg)   LMP 07/30/2015   BMI 35.58 kg/m  Body mass index is 35.58 kg/m. General appearance: Well nourished, well developed female in no acute distress.  Breasts: breasts appear normal, no suspicious masses, no skin or nipple changes or axillary nodes, and normal palpation.  Neuro/Psych:  Normal mood and affect.   Pelvic exam: normal external genitalia (mildly atrophic), vulva (mildly atrophic), vagina, cervix, uterus and adnexa.   Assessment: patient stable  Plan:  1. HPV (human papilloma virus) infection (Primary) - Cytology - PAP( [place])  2. Cervical cancer screening - Cytology - PAP( [place])  3. Breast cancer screening by mammogram - MM 3D DIAGNOSTIC MAMMOGRAM BILATERAL BREAST; Future   RTC: PRN  Return if symptoms worsen or fail to improve.  Future Appointments  Date Time Provider Department Center  11/21/2024 10:40 AM Zuleta, Kaitlin G, NP [facility]  12/04/2024  2:00 PM Marilynne Rosaline SAILOR, MD [facility]    Bebe Izell Raddle MD Attending Center for [facility])

## 2024-10-25 ENCOUNTER — Ambulatory Visit
Admission: RE | Admit: 2024-10-25 | Discharge: 2024-10-25 | Disposition: A | Discharge status: Home / Self Care | Source: Ambulatory Visit | Attending: Obstetrics and Gynecology | Admitting: Obstetrics and Gynecology

## 2024-10-25 ENCOUNTER — Telehealth: Payer: Self-pay | Admitting: Pharmacy Technician

## 2024-10-25 ENCOUNTER — Other Ambulatory Visit (HOSPITAL_COMMUNITY): Payer: Self-pay

## 2024-10-25 DIAGNOSIS — Z1231 Encounter for screening mammogram for malignant neoplasm of breast: Secondary | ICD-10-CM

## 2024-10-25 NOTE — Telephone Encounter (Signed)
 Pharmacy Patient Advocate Encounter  Received notification from [facility] that Prior Authorization for Gemtesa  has been APPROVED from 10/25/24 to 10/25/25   PA #/Case ID/Reference #: [id number]

## 2024-10-25 NOTE — Telephone Encounter (Signed)
 Pharmacy Patient Advocate Encounter   Received notification from Pt Calls Messages that prior authorization for Gemtesa  75mg  is required/requested.   Insurance verification completed.   The patient is insured through [employer].   Per test claim: PA required; PA submitted to above mentioned insurance via Latent Key/confirmation #/EOC [id number] Status is pending

## 2024-10-25 NOTE — Telephone Encounter (Signed)
 PA request has been Received. New Encounter has been or will be created for follow up. For additional info see Pharmacy Prior Auth telephone encounter from 10/25/24.

## 2024-10-26 NOTE — Telephone Encounter (Signed)
 Patient has been notified

## 2024-10-29 LAB — CYTOLOGY - PAP
Comment: NEGATIVE
Diagnosis: NEGATIVE
High risk HPV: NEGATIVE

## 2024-11-02 ENCOUNTER — Ambulatory Visit: Payer: Self-pay | Admitting: Obstetrics and Gynecology

## 2024-11-06 ENCOUNTER — Telehealth: Payer: Self-pay | Admitting: Neurology

## 2024-11-06 NOTE — Telephone Encounter (Signed)
 Patient has not viewed Pap results. Last active on MyChart 06/23/20.VM left stating calling with normal results and patient may view in MyChart. Link sent via text to reset password if needed.

## 2024-11-06 NOTE — Telephone Encounter (Signed)
 Pt called returning call , pt informed she is not ready to schedule for sleep  at this time will follow up maybe next year

## 2024-11-06 NOTE — Telephone Encounter (Addendum)
 Patient returned call. Reviewed results and recommendation for Pap in 1 year.

## 2024-11-21 ENCOUNTER — Encounter: Admitting: Obstetrics and Gynecology

## 2024-11-26 ENCOUNTER — Encounter: Payer: Self-pay | Admitting: *Deleted

## 2024-11-29 ENCOUNTER — Other Ambulatory Visit: Payer: Self-pay | Admitting: Nurse Practitioner

## 2024-11-29 DIAGNOSIS — M5416 Radiculopathy, lumbar region: Secondary | ICD-10-CM

## 2024-12-04 ENCOUNTER — Ambulatory Visit
Admission: RE | Admit: 2024-12-04 | Discharge: 2024-12-04 | Disposition: A | Source: Ambulatory Visit | Attending: Nurse Practitioner | Admitting: Nurse Practitioner

## 2024-12-04 ENCOUNTER — Ambulatory Visit: Admitting: Obstetrics and Gynecology

## 2024-12-04 DIAGNOSIS — M5416 Radiculopathy, lumbar region: Secondary | ICD-10-CM

## 2024-12-26 ENCOUNTER — Encounter: Admitting: Obstetrics and Gynecology

## 2025-01-21 ENCOUNTER — Ambulatory Visit: Admitting: Obstetrics and Gynecology

## 2025-01-23 ENCOUNTER — Ambulatory Visit: Admitting: Obstetrics and Gynecology
